# Patient Record
Sex: Male | Born: 1945 | Race: White | Hispanic: No | Marital: Married | Smoking: Never smoker
Health system: Southern US, Community
[De-identification: ages and names within clinical notes are randomized; demographics above are authoritative.]

## PROBLEM LIST (undated history)

## (undated) DIAGNOSIS — M1990 Unspecified osteoarthritis, unspecified site: Secondary | ICD-10-CM

## (undated) DIAGNOSIS — C801 Malignant (primary) neoplasm, unspecified: Secondary | ICD-10-CM

## (undated) DIAGNOSIS — I10 Essential (primary) hypertension: Secondary | ICD-10-CM

## (undated) DIAGNOSIS — Z951 Presence of aortocoronary bypass graft: Secondary | ICD-10-CM

## (undated) DIAGNOSIS — E7800 Pure hypercholesterolemia, unspecified: Secondary | ICD-10-CM

## (undated) DIAGNOSIS — I2510 Atherosclerotic heart disease of native coronary artery without angina pectoris: Secondary | ICD-10-CM

## (undated) HISTORY — PX: MOHS SURGERY: SUR867

## (undated) HISTORY — PX: CORONARY ARTERY BYPASS GRAFT: SHX141

## (undated) HISTORY — PX: MODIFIED RADICAL MASTECTOMY: SHX5703

## (undated) HISTORY — PX: CARDIAC SURGERY: SHX584

---

## 1997-08-08 ENCOUNTER — Other Ambulatory Visit: Admission: RE | Admit: 1997-08-08 | Discharge: 1997-08-08 | Payer: Self-pay | Admitting: Otolaryngology

## 1997-09-22 ENCOUNTER — Emergency Department (HOSPITAL_COMMUNITY): Admission: EM | Admit: 1997-09-22 | Discharge: 1997-09-22 | Payer: Self-pay | Admitting: Emergency Medicine

## 2000-04-12 ENCOUNTER — Encounter: Admission: RE | Admit: 2000-04-12 | Discharge: 2000-04-12 | Payer: Self-pay | Admitting: *Deleted

## 2000-04-12 ENCOUNTER — Encounter: Payer: Self-pay | Admitting: *Deleted

## 2010-04-25 HISTORY — PX: CORONARY ARTERY BYPASS GRAFT: SHX141

## 2011-07-14 ENCOUNTER — Encounter (HOSPITAL_COMMUNITY)
Admission: RE | Admit: 2011-07-14 | Discharge: 2011-07-14 | Disposition: A | Discharge status: Home / Self Care | Payer: Non-veteran care | Source: Ambulatory Visit | Attending: Cardiology | Admitting: Cardiology

## 2011-07-14 DIAGNOSIS — I10 Essential (primary) hypertension: Secondary | ICD-10-CM | POA: Insufficient documentation

## 2011-07-14 DIAGNOSIS — Z5189 Encounter for other specified aftercare: Secondary | ICD-10-CM | POA: Insufficient documentation

## 2011-07-14 DIAGNOSIS — E785 Hyperlipidemia, unspecified: Secondary | ICD-10-CM | POA: Insufficient documentation

## 2011-07-14 DIAGNOSIS — Z951 Presence of aortocoronary bypass graft: Secondary | ICD-10-CM | POA: Insufficient documentation

## 2011-07-14 NOTE — Progress Notes (Signed)
Cardiac Rehab Medication Review by a Pharmacist  Does the patient  feel that his/her medications are working for him/her?  yes  Has the patient been experiencing any side effects to the medications prescribed?  no  Does the patient measure his/her own blood pressure or blood glucose at home?  no   Does the patient have any problems obtaining medications due to transportation or finances?   no  Understanding of regimen: good Understanding of indications: good Potential of compliance: excellent    Pharmacist comments: Patient did not remember to bring his medications in today but will bring them in next time. Please adjust strengths in medication history as appropriate.    Richard Camacho, [place] R 07/14/2011 8:33 AM

## 2011-07-18 ENCOUNTER — Encounter (HOSPITAL_COMMUNITY)
Admission: RE | Admit: 2011-07-18 | Discharge: 2011-07-18 | Disposition: A | Discharge status: Home / Self Care | Payer: Non-veteran care | Source: Ambulatory Visit | Attending: Cardiology | Admitting: Cardiology

## 2011-07-18 NOTE — Progress Notes (Signed)
Pt started cardiac rehab today.  Pt tolerated light exercise without difficulty. Monitor showed SR with st depression and no other ectopy noted.  Pt plans to exercise on 6:45 on Mondays and Wednesdays but 8:15 on Fridays.  Continue to monitor.

## 2011-07-20 ENCOUNTER — Encounter (HOSPITAL_COMMUNITY)
Admission: RE | Admit: 2011-07-20 | Discharge: 2011-07-20 | Disposition: A | Discharge status: Home / Self Care | Payer: Non-veteran care | Source: Ambulatory Visit | Attending: Cardiology | Admitting: Cardiology

## 2011-07-20 NOTE — Progress Notes (Signed)
Richard Camacho 66 y.o. male       Nutrition Screen                                                                    YES  NO Do you live in a nursing home?  X   Do you eat out more than 3 times/week?    X If yes, how many times per week do you eat out?  Do you have food allergies?   X If yes, what are you allergic to?  Have you gained or lost more than 10 lbs without trying?               X If yes, how much weight have you lost and over what time period?  lbs gained or lost over  weeks/month  Do you want to lose weight?    X  If yes, what is a goal weight or amount of weight you would like to lose? 185 lb/ 30 lb goal  Do you eat alone most of the time?   X   Do you eat less than 2 meals/day?  X If yes, how many meals do you eat?  Do you drink more than 3 alcohol drinks/day?  X If yes, how many drinks per day?  Are you having trouble with constipation? *  X If yes, what are you doing to help relieve constipation?  Do you have financial difficulties with buying food?*    X   Are you experiencing regular nausea/ vomiting?*     X   Do you have a poor appetite? *                                        X   Do you have trouble chewing/swallowing? *   X    Pt with diagnoses of:  X CABG              X Dyslipidemia  / HDL< 40 / LDL>70 / High TG      X %  Body fat >goal / Body Mass Index >25 X HTN / BP >120/80       Pt Risk Score   1       Diagnosis Risk Score  20       Total Risk Score   21                         High Risk               X Low Risk    HT: 70.75" Ht Readings from Last 1 Encounters:  07/14/11 5' 10.75" (1.797 m)    WT:   211.9 lb (96.3 kg) Wt Readings from Last 3 Encounters:  07/14/11 212 lb 4.9 oz (96.3 kg)     IBW 77.5 124%IBW BMI 29.8 30.1%body fat  Meds reviewed: MVI, Vitamin D, Diovan  PMH: sleep apnea/ hypersomnia Activity level: Pt is active    Wt goal: 188-200 lb ( 85.5-90.9 kg) Current tobacco use? No      Food/Drug Interaction? No  Labs:  Lipid  Panel  No results found for this basename: chol, trig, hdl, cholhdl, vldl, ldlcalc   No results found for this basename: HGBA1C  03/28/11 Glucose 140  LDL goal: < 100       MI, DM, Carotid or PVD and > 2:      HTN, > 67 yo male Estimated Daily Nutrition Needs for: ? wt loss  1650 Kcal , Total Fat 2150gm, Saturated Fat 45-60 gm, Trans Fat 1.6-2.1 gm,  Sodium less than 1500 mg

## 2011-07-22 ENCOUNTER — Encounter (HOSPITAL_COMMUNITY): Payer: Non-veteran care

## 2011-07-22 ENCOUNTER — Encounter (HOSPITAL_COMMUNITY)
Admission: RE | Admit: 2011-07-22 | Discharge: 2011-07-22 | Disposition: A | Discharge status: Home / Self Care | Payer: Non-veteran care | Source: Ambulatory Visit | Attending: Cardiology | Admitting: Cardiology

## 2011-07-22 NOTE — Progress Notes (Signed)
Rehab report, progress summary and treatment plan sent to Non-Va Coordinator per request.

## 2011-07-25 ENCOUNTER — Encounter (HOSPITAL_COMMUNITY)
Admission: RE | Admit: 2011-07-25 | Discharge: 2011-07-25 | Disposition: A | Discharge status: Home / Self Care | Payer: Non-veteran care | Source: Ambulatory Visit | Attending: Cardiology | Admitting: Cardiology

## 2011-07-25 DIAGNOSIS — E785 Hyperlipidemia, unspecified: Secondary | ICD-10-CM | POA: Insufficient documentation

## 2011-07-25 DIAGNOSIS — I10 Essential (primary) hypertension: Secondary | ICD-10-CM | POA: Insufficient documentation

## 2011-07-25 DIAGNOSIS — Z5189 Encounter for other specified aftercare: Secondary | ICD-10-CM | POA: Insufficient documentation

## 2011-07-25 DIAGNOSIS — Z951 Presence of aortocoronary bypass graft: Secondary | ICD-10-CM | POA: Insufficient documentation

## 2011-07-27 ENCOUNTER — Encounter (HOSPITAL_COMMUNITY)
Admission: RE | Admit: 2011-07-27 | Discharge: 2011-07-27 | Disposition: A | Discharge status: Home / Self Care | Payer: Non-veteran care | Source: Ambulatory Visit | Attending: Cardiology | Admitting: Cardiology

## 2011-07-29 ENCOUNTER — Encounter (HOSPITAL_COMMUNITY)
Admission: RE | Admit: 2011-07-29 | Discharge: 2011-07-29 | Disposition: A | Discharge status: Home / Self Care | Payer: Non-veteran care | Source: Ambulatory Visit | Attending: Cardiology | Admitting: Cardiology

## 2011-07-29 ENCOUNTER — Encounter (HOSPITAL_COMMUNITY): Payer: Non-veteran care

## 2011-08-01 ENCOUNTER — Encounter (HOSPITAL_COMMUNITY)
Admission: RE | Admit: 2011-08-01 | Discharge: 2011-08-01 | Disposition: A | Discharge status: Home / Self Care | Payer: Non-veteran care | Source: Ambulatory Visit | Attending: Cardiology | Admitting: Cardiology

## 2011-08-03 ENCOUNTER — Encounter (HOSPITAL_COMMUNITY)
Admission: RE | Admit: 2011-08-03 | Discharge: 2011-08-03 | Disposition: A | Discharge status: Home / Self Care | Payer: Non-veteran care | Source: Ambulatory Visit | Attending: Cardiology | Admitting: Cardiology

## 2011-08-03 NOTE — Progress Notes (Signed)
Richard Camacho 66 y.o. male Nutrition Note Spoke with pt.  Nutrition Plan reviewed with pt. Pt's MEDFICTS was returned incomplete. Per discussion with pt, pt tracks his daily food intake on-line. Pt to brink food log in to class next week. Pt wants to lose wt and has decreased portion sizes eaten to promote wt loss. Weight loss tips discussed.    Nutrition Diagnosis   Food-and nutrition-related knowledge deficit related to lack of exposure to information as related to diagnosis of: ? CVD    Overweight related to excessive energy intake as evidenced by a BMI of 29.8  Nutrition RX/ Estimated Daily Nutrition Needs for: wt loss  1650-2150 Kcal, 45-60 gm fat, 12-16 gm sat fat, 1.6-2.1 gm trans-fat, <1500 mg sodium  Nutrition Intervention   Pt's individual nutrition plan including cholesterol goals reviewed with pt.   Pt to attend the Portion Distortion class   Pt to attend the  ? Nutrition I class                         ? Nutrition II class    Pt given handouts for: ? wt loss   Continue client-centered nutrition education by RD, as part of interdisciplinary care. Goal(s)   Pt to identify and limit food sources of saturated fat, trans fat, and cholesterol   Pt to identify food quantities necessary to achieve: ? wt loss to a goal wt of 188-200 lb (85.5-90.9 kg) at graduation from cardiac rehab.  Monitor and Evaluate progress toward nutrition goal with team.

## 2011-08-05 ENCOUNTER — Encounter (HOSPITAL_COMMUNITY): Payer: Non-veteran care

## 2011-08-08 ENCOUNTER — Encounter (HOSPITAL_COMMUNITY)
Admission: RE | Admit: 2011-08-08 | Discharge: 2011-08-08 | Disposition: A | Discharge status: Home / Self Care | Payer: Non-veteran care | Source: Ambulatory Visit | Attending: Cardiology | Admitting: Cardiology

## 2011-08-10 ENCOUNTER — Encounter (HOSPITAL_COMMUNITY)
Admission: RE | Admit: 2011-08-10 | Discharge: 2011-08-10 | Disposition: A | Discharge status: Home / Self Care | Payer: Non-veteran care | Source: Ambulatory Visit | Attending: Cardiology | Admitting: Cardiology

## 2011-08-10 NOTE — Progress Notes (Signed)
Reviewed home exercise with pt today.  Pt plans to walk, use light resistive bands, and exercise at a health club for exercise.  Reviewed THR, pulse, RPE, sign and symptoms, and when to call 911 or MD.  Pt voiced understanding. Electronically signed by Harriett Sine MS on Wednesday August 10 2011 at [id number]

## 2011-08-12 ENCOUNTER — Encounter (HOSPITAL_COMMUNITY): Payer: Non-veteran care

## 2011-08-15 ENCOUNTER — Encounter (HOSPITAL_COMMUNITY)
Admission: RE | Admit: 2011-08-15 | Discharge: 2011-08-15 | Disposition: A | Discharge status: Home / Self Care | Payer: Non-veteran care | Source: Ambulatory Visit | Attending: Cardiology | Admitting: Cardiology

## 2011-08-17 ENCOUNTER — Encounter (HOSPITAL_COMMUNITY)
Admission: RE | Admit: 2011-08-17 | Discharge: 2011-08-17 | Disposition: A | Discharge status: Home / Self Care | Payer: Non-veteran care | Source: Ambulatory Visit | Attending: Cardiology | Admitting: Cardiology

## 2011-08-19 ENCOUNTER — Encounter (HOSPITAL_COMMUNITY): Payer: Non-veteran care

## 2011-08-22 ENCOUNTER — Encounter (HOSPITAL_COMMUNITY)
Admission: RE | Admit: 2011-08-22 | Discharge: 2011-08-22 | Disposition: A | Discharge status: Home / Self Care | Payer: Non-veteran care | Source: Ambulatory Visit | Attending: Cardiology | Admitting: Cardiology

## 2011-08-24 ENCOUNTER — Encounter (HOSPITAL_COMMUNITY)
Admission: RE | Admit: 2011-08-24 | Discharge: 2011-08-24 | Disposition: A | Discharge status: Home / Self Care | Payer: Non-veteran care | Source: Ambulatory Visit | Attending: Cardiology | Admitting: Cardiology

## 2011-08-24 DIAGNOSIS — Z5189 Encounter for other specified aftercare: Secondary | ICD-10-CM | POA: Insufficient documentation

## 2011-08-24 DIAGNOSIS — I10 Essential (primary) hypertension: Secondary | ICD-10-CM | POA: Insufficient documentation

## 2011-08-24 DIAGNOSIS — Z951 Presence of aortocoronary bypass graft: Secondary | ICD-10-CM | POA: Insufficient documentation

## 2011-08-24 DIAGNOSIS — E785 Hyperlipidemia, unspecified: Secondary | ICD-10-CM | POA: Insufficient documentation

## 2011-08-24 NOTE — Progress Notes (Signed)
Pt in today for cardiac rehab.  During first station, pt called RN over.  Pt reports that he had an episode of visual disturbances yesterday while driving his car.  Pt reports that for about 5 minutes he had a bright light that was shinning in his eyes and complained of floaters to the side.  Pt was alarmed and called his wife.  Pt thought about pulling over to the side of the road because he felt it was not safe to continue driving.  At that point the disturbance went away with no return of symptoms.  Pt reported that he has never felt this before.  Pt denied any other symptoms of discomfort, nausea, speech, loss of motor activity.  Pt plans to contact his primary physician for further assessment and possible contact his eye doctor who he saw last year.  Pt denies any symptoms today with exercise.

## 2011-08-26 ENCOUNTER — Encounter (HOSPITAL_COMMUNITY): Payer: Non-veteran care

## 2011-08-29 ENCOUNTER — Encounter (HOSPITAL_COMMUNITY)
Admission: RE | Admit: 2011-08-29 | Discharge: 2011-08-29 | Disposition: A | Discharge status: Home / Self Care | Payer: Non-veteran care | Source: Ambulatory Visit | Attending: Cardiology | Admitting: Cardiology

## 2011-08-29 NOTE — Progress Notes (Signed)
Pt el ected not to contact his primary about his symptoms.  Pt will discuss this with his md on his next visit.

## 2011-08-31 ENCOUNTER — Encounter (HOSPITAL_COMMUNITY): Payer: Non-veteran care

## 2011-08-31 ENCOUNTER — Telehealth (HOSPITAL_COMMUNITY): Payer: Self-pay | Admitting: Cardiac Rehabilitation

## 2011-09-02 ENCOUNTER — Encounter (HOSPITAL_COMMUNITY): Payer: Non-veteran care

## 2011-09-05 ENCOUNTER — Encounter (HOSPITAL_COMMUNITY)
Admission: RE | Admit: 2011-09-05 | Discharge: 2011-09-05 | Disposition: A | Discharge status: Home / Self Care | Payer: Non-veteran care | Source: Ambulatory Visit | Attending: Cardiology | Admitting: Cardiology

## 2011-09-07 ENCOUNTER — Encounter (HOSPITAL_COMMUNITY)
Admission: RE | Admit: 2011-09-07 | Discharge: 2011-09-07 | Disposition: A | Discharge status: Home / Self Care | Payer: Non-veteran care | Source: Ambulatory Visit | Attending: Cardiology | Admitting: Cardiology

## 2011-09-09 ENCOUNTER — Encounter (HOSPITAL_COMMUNITY): Payer: Non-veteran care

## 2011-09-09 ENCOUNTER — Encounter (HOSPITAL_COMMUNITY)
Admission: RE | Admit: 2011-09-09 | Discharge: 2011-09-09 | Disposition: A | Discharge status: Home / Self Care | Payer: Non-veteran care | Source: Ambulatory Visit | Attending: Cardiology | Admitting: Cardiology

## 2011-09-12 ENCOUNTER — Encounter (HOSPITAL_COMMUNITY)
Admission: RE | Admit: 2011-09-12 | Discharge: 2011-09-12 | Disposition: A | Discharge status: Home / Self Care | Payer: Non-veteran care | Source: Ambulatory Visit | Attending: Cardiology | Admitting: Cardiology

## 2011-09-12 NOTE — Progress Notes (Signed)
Richard Camacho 66 y.o. male Nutrition Note Spoke with pt.  Incomplete MEDFICTS completed with pt. Nutrition Survey results reviewed. Per previous discussion with pt, pt tracks his daily food intake on-line and pt wanted to bring food log in for this writer to review. Pt has not brought his food log in for review at this point. Pt eats out daily for lunch with his partners. Per discussion, pt choosing salads frequently. Eating out heart healthy briefly reviewed. Pt expressed understanding. Nutrition Diagnosis   Food-and nutrition-related knowledge deficit related to lack of exposure to information as related to diagnosis of: ? CVD    Overweight related to excessive energy intake as evidenced by a BMI of 29.8 Nutrition RX/ Estimated Daily Nutrition Needs for: wt loss  1650-2150 Kcal, 45-60 gm fat, 12-16 gm sat fat, 1.6-2.1 gm trans-fat, <1500 mg sodium  Nutrition Intervention   Benefits of adopting Therapeutic Lifestyle Changes discussed when Medficts reviewed.   Pt to attend the Portion Distortion class   Pt to attend the  ? Nutrition I class                         ? Nutrition II class   Continue client-centered nutrition education by RD, as part of interdisciplinary care. Goal(s)   Pt to identify and limit food sources of saturated fat, trans fat, and cholesterol   Pt to identify food quantities necessary to achieve: ? wt loss to a goal wt of 188-200 lb (85.5-90.9 kg) at graduation from cardiac rehab.  Monitor and Evaluate progress toward nutrition goal with team.

## 2011-09-14 ENCOUNTER — Encounter (HOSPITAL_COMMUNITY)
Admission: RE | Admit: 2011-09-14 | Discharge: 2011-09-14 | Disposition: A | Discharge status: Home / Self Care | Payer: Non-veteran care | Source: Ambulatory Visit | Attending: Cardiology | Admitting: Cardiology

## 2011-09-16 ENCOUNTER — Encounter (HOSPITAL_COMMUNITY)
Admission: RE | Admit: 2011-09-16 | Discharge: 2011-09-16 | Disposition: A | Discharge status: Home / Self Care | Payer: Non-veteran care | Source: Ambulatory Visit | Attending: Cardiology | Admitting: Cardiology

## 2011-09-16 ENCOUNTER — Encounter (HOSPITAL_COMMUNITY): Payer: Non-veteran care

## 2011-09-21 ENCOUNTER — Encounter (HOSPITAL_COMMUNITY)
Admission: RE | Admit: 2011-09-21 | Discharge: 2011-09-21 | Disposition: A | Discharge status: Home / Self Care | Payer: Non-veteran care | Source: Ambulatory Visit | Attending: Cardiology | Admitting: Cardiology

## 2011-09-23 ENCOUNTER — Encounter (HOSPITAL_COMMUNITY)
Admission: RE | Admit: 2011-09-23 | Discharge: 2011-09-23 | Disposition: A | Discharge status: Home / Self Care | Payer: Non-veteran care | Source: Ambulatory Visit | Attending: Cardiology | Admitting: Cardiology

## 2011-09-23 ENCOUNTER — Encounter (HOSPITAL_COMMUNITY): Payer: Non-veteran care

## 2011-09-26 ENCOUNTER — Encounter (HOSPITAL_COMMUNITY)
Admission: RE | Admit: 2011-09-26 | Discharge: 2011-09-26 | Disposition: A | Discharge status: Home / Self Care | Payer: Non-veteran care | Source: Ambulatory Visit | Attending: Cardiology | Admitting: Cardiology

## 2011-09-26 DIAGNOSIS — Z5189 Encounter for other specified aftercare: Secondary | ICD-10-CM | POA: Insufficient documentation

## 2011-09-26 DIAGNOSIS — Z951 Presence of aortocoronary bypass graft: Secondary | ICD-10-CM | POA: Insufficient documentation

## 2011-09-26 DIAGNOSIS — I10 Essential (primary) hypertension: Secondary | ICD-10-CM | POA: Insufficient documentation

## 2011-09-26 DIAGNOSIS — E785 Hyperlipidemia, unspecified: Secondary | ICD-10-CM | POA: Insufficient documentation

## 2011-09-28 ENCOUNTER — Encounter (HOSPITAL_COMMUNITY)
Admission: RE | Admit: 2011-09-28 | Discharge: 2011-09-28 | Disposition: A | Discharge status: Home / Self Care | Payer: Non-veteran care | Source: Ambulatory Visit | Attending: Cardiology | Admitting: Cardiology

## 2011-09-30 ENCOUNTER — Encounter (HOSPITAL_COMMUNITY): Payer: Non-veteran care

## 2011-09-30 ENCOUNTER — Encounter (HOSPITAL_COMMUNITY)
Admission: RE | Admit: 2011-09-30 | Discharge: 2011-09-30 | Disposition: A | Discharge status: Home / Self Care | Payer: Non-veteran care | Source: Ambulatory Visit | Attending: Cardiology | Admitting: Cardiology

## 2011-10-03 ENCOUNTER — Encounter (HOSPITAL_COMMUNITY)
Admission: RE | Admit: 2011-10-03 | Discharge: 2011-10-03 | Disposition: A | Discharge status: Home / Self Care | Payer: Non-veteran care | Source: Ambulatory Visit | Attending: Cardiology | Admitting: Cardiology

## 2011-10-05 ENCOUNTER — Encounter (HOSPITAL_COMMUNITY)
Admission: RE | Admit: 2011-10-05 | Discharge: 2011-10-05 | Disposition: A | Discharge status: Home / Self Care | Payer: Non-veteran care | Source: Ambulatory Visit | Attending: Cardiology | Admitting: Cardiology

## 2011-10-06 NOTE — Progress Notes (Signed)
Spoke with patient at great length regarding physical sensations he feels after sitting in reclined position.  Reassured pt that recovery from heart surgery is lengthy and to be careful not to overdo to quickly.  Pt encouraged to contact his physician in follow up.

## 2011-10-07 ENCOUNTER — Encounter (HOSPITAL_COMMUNITY)
Admission: RE | Admit: 2011-10-07 | Discharge: 2011-10-07 | Disposition: A | Discharge status: Home / Self Care | Payer: Non-veteran care | Source: Ambulatory Visit | Attending: Cardiology | Admitting: Cardiology

## 2011-10-07 ENCOUNTER — Encounter (HOSPITAL_COMMUNITY): Payer: Non-veteran care

## 2011-10-10 ENCOUNTER — Encounter (HOSPITAL_COMMUNITY)
Admission: RE | Admit: 2011-10-10 | Discharge: 2011-10-10 | Disposition: A | Discharge status: Home / Self Care | Payer: Non-veteran care | Source: Ambulatory Visit | Attending: Cardiology | Admitting: Cardiology

## 2011-10-12 ENCOUNTER — Encounter (HOSPITAL_COMMUNITY)
Admission: RE | Admit: 2011-10-12 | Discharge: 2011-10-12 | Disposition: A | Discharge status: Home / Self Care | Payer: Non-veteran care | Source: Ambulatory Visit | Attending: Cardiology | Admitting: Cardiology

## 2011-10-14 ENCOUNTER — Encounter (HOSPITAL_COMMUNITY)
Admission: RE | Admit: 2011-10-14 | Discharge: 2011-10-14 | Disposition: A | Discharge status: Home / Self Care | Payer: Non-veteran care | Source: Ambulatory Visit | Attending: Cardiology | Admitting: Cardiology

## 2011-10-14 ENCOUNTER — Encounter (HOSPITAL_COMMUNITY): Payer: Non-veteran care

## 2011-10-14 NOTE — Progress Notes (Signed)
Pt will be absent from cardiac rehab the week of 6/24 due to vacation at the beach.

## 2011-10-17 ENCOUNTER — Encounter (HOSPITAL_COMMUNITY): Payer: Non-veteran care

## 2011-10-19 ENCOUNTER — Encounter (HOSPITAL_COMMUNITY): Payer: Non-veteran care

## 2011-10-21 ENCOUNTER — Encounter (HOSPITAL_COMMUNITY): Payer: Non-veteran care

## 2011-10-24 ENCOUNTER — Encounter (HOSPITAL_COMMUNITY)
Admission: RE | Admit: 2011-10-24 | Discharge: 2011-10-24 | Disposition: A | Discharge status: Home / Self Care | Payer: Non-veteran care | Source: Ambulatory Visit | Attending: Cardiology | Admitting: Cardiology

## 2011-10-24 DIAGNOSIS — I10 Essential (primary) hypertension: Secondary | ICD-10-CM | POA: Insufficient documentation

## 2011-10-24 DIAGNOSIS — E785 Hyperlipidemia, unspecified: Secondary | ICD-10-CM | POA: Insufficient documentation

## 2011-10-24 DIAGNOSIS — Z5189 Encounter for other specified aftercare: Secondary | ICD-10-CM | POA: Insufficient documentation

## 2011-10-24 DIAGNOSIS — Z951 Presence of aortocoronary bypass graft: Secondary | ICD-10-CM | POA: Insufficient documentation

## 2011-10-26 ENCOUNTER — Encounter (HOSPITAL_COMMUNITY)
Admission: RE | Admit: 2011-10-26 | Discharge: 2011-10-26 | Disposition: A | Discharge status: Home / Self Care | Payer: Non-veteran care | Source: Ambulatory Visit | Attending: Cardiology | Admitting: Cardiology

## 2011-10-28 ENCOUNTER — Encounter (HOSPITAL_COMMUNITY)
Admission: RE | Admit: 2011-10-28 | Discharge: 2011-10-28 | Disposition: A | Discharge status: Home / Self Care | Payer: Non-veteran care | Source: Ambulatory Visit | Attending: Cardiology | Admitting: Cardiology

## 2011-10-28 NOTE — Progress Notes (Signed)
Pt graduated from exercise program.  Medication list reconciled.  Pt given information about the maintenance program and will contact us if he desires to attend.

## 2011-10-31 ENCOUNTER — Encounter (HOSPITAL_COMMUNITY): Payer: Non-veteran care

## 2011-11-02 ENCOUNTER — Encounter (HOSPITAL_COMMUNITY): Payer: Non-veteran care

## 2011-11-04 ENCOUNTER — Encounter (HOSPITAL_COMMUNITY): Payer: Non-veteran care

## 2011-11-07 ENCOUNTER — Encounter (HOSPITAL_COMMUNITY): Payer: Non-veteran care

## 2011-11-09 ENCOUNTER — Encounter (HOSPITAL_COMMUNITY): Payer: Non-veteran care

## 2011-11-11 ENCOUNTER — Encounter (HOSPITAL_COMMUNITY): Payer: Non-veteran care

## 2011-11-14 ENCOUNTER — Encounter (HOSPITAL_COMMUNITY): Payer: Non-veteran care

## 2011-11-16 ENCOUNTER — Encounter (HOSPITAL_COMMUNITY): Payer: Non-veteran care

## 2011-11-18 ENCOUNTER — Encounter (HOSPITAL_COMMUNITY): Payer: Non-veteran care

## 2012-08-08 DIAGNOSIS — Z85828 Personal history of other malignant neoplasm of skin: Secondary | ICD-10-CM | POA: Insufficient documentation

## 2013-07-18 ENCOUNTER — Emergency Department (HOSPITAL_COMMUNITY): Payer: Non-veteran care

## 2013-07-18 ENCOUNTER — Encounter (HOSPITAL_COMMUNITY): Payer: Self-pay | Admitting: Emergency Medicine

## 2013-07-18 ENCOUNTER — Observation Stay (HOSPITAL_COMMUNITY)
Admission: EM | Admit: 2013-07-18 | Discharge: 2013-07-20 | Disposition: A | Discharge status: Home / Self Care | Payer: Non-veteran care | Attending: Internal Medicine | Admitting: Internal Medicine

## 2013-07-18 DIAGNOSIS — E7800 Pure hypercholesterolemia, unspecified: Secondary | ICD-10-CM | POA: Insufficient documentation

## 2013-07-18 DIAGNOSIS — E782 Mixed hyperlipidemia: Secondary | ICD-10-CM | POA: Diagnosis present

## 2013-07-18 DIAGNOSIS — Z951 Presence of aortocoronary bypass graft: Secondary | ICD-10-CM | POA: Insufficient documentation

## 2013-07-18 DIAGNOSIS — R0602 Shortness of breath: Secondary | ICD-10-CM | POA: Insufficient documentation

## 2013-07-18 DIAGNOSIS — Z7982 Long term (current) use of aspirin: Secondary | ICD-10-CM | POA: Insufficient documentation

## 2013-07-18 DIAGNOSIS — E785 Hyperlipidemia, unspecified: Secondary | ICD-10-CM | POA: Insufficient documentation

## 2013-07-18 DIAGNOSIS — I2510 Atherosclerotic heart disease of native coronary artery without angina pectoris: Secondary | ICD-10-CM | POA: Diagnosis present

## 2013-07-18 DIAGNOSIS — R55 Syncope and collapse: Principal | ICD-10-CM | POA: Diagnosis present

## 2013-07-18 DIAGNOSIS — I498 Other specified cardiac arrhythmias: Secondary | ICD-10-CM | POA: Insufficient documentation

## 2013-07-18 DIAGNOSIS — R001 Bradycardia, unspecified: Secondary | ICD-10-CM

## 2013-07-18 DIAGNOSIS — I10 Essential (primary) hypertension: Secondary | ICD-10-CM | POA: Diagnosis present

## 2013-07-18 DIAGNOSIS — Z9010 Acquired absence of unspecified breast and nipple: Secondary | ICD-10-CM | POA: Insufficient documentation

## 2013-07-18 DIAGNOSIS — G4733 Obstructive sleep apnea (adult) (pediatric): Secondary | ICD-10-CM | POA: Insufficient documentation

## 2013-07-18 HISTORY — DX: Pure hypercholesterolemia, unspecified: E78.00

## 2013-07-18 HISTORY — DX: Atherosclerotic heart disease of native coronary artery without angina pectoris: I25.10

## 2013-07-18 HISTORY — DX: Essential (primary) hypertension: I10

## 2013-07-18 LAB — I-STAT CHEM 8, ED
BUN: 13 mg/dL (ref 6–23)
Calcium, Ion: 1.25 mmol/L (ref 1.13–1.30)
Chloride: 105 mEq/L (ref 96–112)
Creatinine, Ser: 0.8 mg/dL (ref 0.50–1.35)
Glucose, Bld: 108 mg/dL — ABNORMAL HIGH (ref 70–99)
HCT: 43 % (ref 39.0–52.0)
HEMOGLOBIN: 14.6 g/dL (ref 13.0–17.0)
POTASSIUM: 4 meq/L (ref 3.7–5.3)
SODIUM: 141 meq/L (ref 137–147)
TCO2: 26 mmol/L (ref 0–100)

## 2013-07-18 LAB — RAPID URINE DRUG SCREEN, HOSP PERFORMED
Amphetamines: NOT DETECTED
BENZODIAZEPINES: NOT DETECTED
Barbiturates: NOT DETECTED
Cocaine: NOT DETECTED
Opiates: NOT DETECTED
Tetrahydrocannabinol: NOT DETECTED

## 2013-07-18 LAB — CBC WITH DIFFERENTIAL/PLATELET
BASOS PCT: 1 % (ref 0–1)
Basophils Absolute: 0 10*3/uL (ref 0.0–0.1)
Eosinophils Absolute: 0.7 10*3/uL (ref 0.0–0.7)
Eosinophils Relative: 9 % — ABNORMAL HIGH (ref 0–5)
HCT: 39.3 % (ref 39.0–52.0)
Hemoglobin: 14.3 g/dL (ref 13.0–17.0)
Lymphocytes Relative: 33 % (ref 12–46)
Lymphs Abs: 2.4 10*3/uL (ref 0.7–4.0)
MCH: 34.8 pg — ABNORMAL HIGH (ref 26.0–34.0)
MCHC: 36.4 g/dL — AB (ref 30.0–36.0)
MCV: 95.6 fL (ref 78.0–100.0)
Monocytes Absolute: 0.5 10*3/uL (ref 0.1–1.0)
Monocytes Relative: 7 % (ref 3–12)
NEUTROS PCT: 50 % (ref 43–77)
Neutro Abs: 3.7 10*3/uL (ref 1.7–7.7)
PLATELETS: 232 10*3/uL (ref 150–400)
RBC: 4.11 MIL/uL — ABNORMAL LOW (ref 4.22–5.81)
RDW: 12.3 % (ref 11.5–15.5)
WBC: 7.3 10*3/uL (ref 4.0–10.5)

## 2013-07-18 LAB — URINALYSIS, ROUTINE W REFLEX MICROSCOPIC
Bilirubin Urine: NEGATIVE
GLUCOSE, UA: NEGATIVE mg/dL
Hgb urine dipstick: NEGATIVE
Ketones, ur: NEGATIVE mg/dL
LEUKOCYTES UA: NEGATIVE
Nitrite: NEGATIVE
PH: 6.5 (ref 5.0–8.0)
Protein, ur: NEGATIVE mg/dL
Specific Gravity, Urine: 1.013 (ref 1.005–1.030)
Urobilinogen, UA: 0.2 mg/dL (ref 0.0–1.0)

## 2013-07-18 LAB — I-STAT TROPONIN, ED: TROPONIN I, POC: 0.01 ng/mL (ref 0.00–0.08)

## 2013-07-18 LAB — ETHANOL: Alcohol, Ethyl (B): <11 mg/dL (ref 0–11)

## 2013-07-18 LAB — COMPREHENSIVE METABOLIC PANEL
ALBUMIN: 4 g/dL (ref 3.5–5.2)
ALK PHOS: 60 U/L (ref 39–117)
ALT: 20 U/L (ref 0–53)
AST: 25 U/L (ref 0–37)
BUN: 13 mg/dL (ref 6–23)
CO2: 23 mEq/L (ref 19–32)
Calcium: 9.9 mg/dL (ref 8.4–10.5)
Chloride: 103 mEq/L (ref 96–112)
Creatinine, Ser: 0.7 mg/dL (ref 0.50–1.35)
GFR calc Af Amer: >90 mL/min (ref >90)
GFR calc non Af Amer: >90 mL/min (ref >90)
Glucose, Bld: 112 mg/dL — ABNORMAL HIGH (ref 70–99)
POTASSIUM: 4.2 meq/L (ref 3.7–5.3)
SODIUM: 140 meq/L (ref 137–147)
TOTAL PROTEIN: 7 g/dL (ref 6.0–8.3)
Total Bilirubin: 0.4 mg/dL (ref 0.3–1.2)

## 2013-07-18 LAB — CBG MONITORING, ED: Glucose-Capillary: 100 mg/dL — ABNORMAL HIGH (ref 70–99)

## 2013-07-18 LAB — APTT: aPTT: 30 seconds (ref 24–37)

## 2013-07-18 LAB — PROTIME-INR
INR: 0.84 (ref 0.00–1.49)
Prothrombin Time: 11.4 seconds — ABNORMAL LOW (ref 11.6–15.2)

## 2013-07-18 NOTE — ED Provider Notes (Signed)
CSN: [id number]     Arrival date & time 07/18/13  1908 History   First MD Initiated Contact with Patient 07/18/13 1924     Chief Complaint  Patient presents with  . Near Syncope  . Episode of bradycadia      (Consider location/radiation/quality/duration/timing/severity/associated sxs/prior Treatment) HPI Comments: Patient is a 68 year old male with history of hypertension, hyperlipidemia, CABG who presents today after having an episode at work. He reports that he had been hearing people on the phone with slurred speech, but people in person did not have slurred speech. He then noticed the words on the computer screen were "jumping around". He went to dial 911 because he had an impending sense of doom. At that time that he felt as though he had loss of motor function in both of his hands. He was able to finish the phone call to 911 and felt as though he was improving. He was diaphoretic and pale for EMS, but was warm and dry on arrival to ED. He reports that he feels well, but has had episodes of feeling "swimmyheaded" sine arriving to the ED. He has never had symptoms like this in the past. He denies any chest pain or shortness of breath.  Patient is a 68 y.o. male presenting with near-syncope. The history is provided by the patient. No language interpreter was used.  Near Syncope Associated symptoms include diaphoresis, nausea and weakness. Pertinent negatives include no abdominal pain, chest pain, chills, fever or vomiting.    Past Medical History  Diagnosis Date  . Hypertension   . High cholesterol    Past Surgical History  Procedure Laterality Date  . Cardiac surgery    . Coronary artery bypass graft     History reviewed. No pertinent family history. History  Substance Use Topics  . Smoking status: Never Smoker   . Smokeless tobacco: Not on file  . Alcohol Use: Yes    Review of Systems  Constitutional: Positive for diaphoresis. Negative for fever and chills.  Eyes: Positive  for visual disturbance.  Respiratory: Negative for shortness of breath.   Cardiovascular: Positive for near-syncope. Negative for chest pain.  Gastrointestinal: Positive for nausea. Negative for vomiting and abdominal pain.  Neurological: Positive for dizziness, weakness and light-headedness.  All other systems reviewed and are negative.      Allergies  Ramipril  Home Medications   Current Outpatient Rx  Name  Route  Sig  Dispense  Refill  . aspirin EC 81 MG tablet   Oral   Take 81 mg by mouth daily.          . cetirizine (ZYRTEC) 10 MG tablet   Oral   Take 10 mg by mouth daily.         . Cholecalciferol (VITAMIN D) 2000 UNITS tablet   Oral   Take 2,000 Units by mouth daily.         Marland Kitchen DOXYCYCLINE PO   Oral   Take 1 tablet by mouth 2 (two) times daily.         . metoprolol tartrate (LOPRESSOR) 25 MG tablet   Oral   Take 12.5 mg by mouth 2 (two) times daily.         . Multiple Vitamins-Minerals (MULTIVITAMIN WITH MINERALS) tablet   Oral   Take 1 tablet by mouth daily.         Marland Kitchen PRESCRIPTION MEDICATION   Topical   Apply 1 application topically 2 (two) times daily. Cream for rosacea to face  twice daily. metrocream or metrogel         . simvastatin (ZOCOR) 40 MG tablet   Oral   Take 20 mg by mouth daily at 6 PM.         . valsartan (DIOVAN) 40 MG tablet   Oral   Take 20 mg by mouth 2 (two) times daily.         . vitamin E 400 UNIT capsule   Oral   Take 400 Units by mouth daily.          BP 140/86  Pulse 71  Temp(Src) 98.6 F (37 C) (Oral)  Resp 18  Wt 215 lb (97.523 kg)  SpO2 95% Physical Exam  Nursing note and vitals reviewed. Constitutional: He is oriented to person, place, and time. He appears well-developed and well-nourished. No distress.  HENT:  Head: Normocephalic and atraumatic.  Right Ear: External ear normal.  Left Ear: External ear normal.  Nose: Nose normal.  Eyes: Conjunctivae and EOM are normal. Pupils are equal,  round, and reactive to light.  Neck: Normal range of motion. No tracheal deviation present.  Cardiovascular: Normal rate, regular rhythm, normal heart sounds, intact distal pulses and normal pulses.   Pulses:      Radial pulses are 2+ on the right side, and 2+ on the left side.       Posterior tibial pulses are 2+ on the right side, and 2+ on the left side.  Pulmonary/Chest: Effort normal and breath sounds normal. No stridor.  Abdominal: Soft. He exhibits no distension. There is no tenderness.  Musculoskeletal: Normal range of motion.  Neurological: He is alert and oriented to person, place, and time. He has normal strength. Coordination normal.  Finger-nose-finger normal. Rapid alternating movements normal. Grip strength 5 out of 5 bilaterally. Strength 5 out of 5 and on extremities. No facial droop. No pronator drift.  Skin: Skin is warm and dry. He is not diaphoretic.  Psychiatric: He has a normal mood and affect. His behavior is normal.    ED Course  Procedures (including critical care time) Labs Review Labs Reviewed  CBC WITH DIFFERENTIAL - Abnormal; Notable for the following:    RBC 4.11 (*)    MCH 34.8 (*)    MCHC 36.4 (*)    Eosinophils Relative 9 (*)    All other components within normal limits  COMPREHENSIVE METABOLIC PANEL - Abnormal; Notable for the following:    Glucose, Bld 112 (*)    All other components within normal limits  PROTIME-INR - Abnormal; Notable for the following:    Prothrombin Time 11.4 (*)    All other components within normal limits  I-STAT CHEM 8, ED - Abnormal; Notable for the following:    Glucose, Bld 108 (*)    All other components within normal limits  CBG MONITORING, ED - Abnormal; Notable for the following:    Glucose-Capillary 100 (*)    All other components within normal limits  ETHANOL  APTT  URINE RAPID DRUG SCREEN (HOSP PERFORMED)  URINALYSIS, ROUTINE W REFLEX MICROSCOPIC  I-STAT TROPOININ, ED  I-STAT TROPOININ, ED  I-STAT  TROPOININ, ED   Imaging Review Ct Head Wo Contrast  07/18/2013   CLINICAL DATA:  Near syncope.  EXAM: CT HEAD WITHOUT CONTRAST  TECHNIQUE: Contiguous axial images were obtained from the base of the skull through the vertex without intravenous contrast.  COMPARISON:  None.  FINDINGS: The ventricles and sulci are within normal limits for age. There is no evidence  of acute infarct, intracranial hemorrhage, mass, midline shift, or extra-axial collection. The orbits are unremarkable. The mastoid air cells are clear. There is mild right anterior ethmoid air cell mucosal thickening. There is a right maxillary sinus mucosal thickening with a likely mucous retention cyst. There is no evidence of acute fracture.  IMPRESSION: No evidence of acute intracranial abnormality.   Electronically Signed   By: Logan Bores   On: 07/18/2013 20:22     EKG Interpretation   Date/Time:  Thursday July 18 2013 19:18:38 EDT Ventricular Rate:  60 PR Interval:  189 QRS Duration: 99 QT Interval:  434 QTC Calculation: 434 R Axis:   -5 Text Interpretation:  Sinus rhythm Abnormal R-wave progression, early  transition Inferior infarct, old No old tracing to compare Confirmed by  [facility]  MD, TREY ([id number]) on 07/18/2013 10:31:26 PM      MDM   Final diagnoses:  CAD (coronary artery disease)  Bradycardia  HTN (hypertension)  Near syncope   Patient presents to ED after episode of near syncope at work. Significant cardiac hx. When EMS arrived on scene patient was diaphoretic and pale with heart rate of 32. Patient has had normal heart rates in ED. Denies any CP or SOB. Concern for cardiac etiology of near syncope. Patient also hearing slurred speech from others. Coworker reports that he was acting normally without slurred speech himself. CT head was done for this reason. No acute findings. Patient will be admitted to medicine for further workup. Dr. Doy Mince evaluated patient and agrees with plan. Vital signs stable at this  time. Patient / Family / Caregiver informed of clinical course, understand medical decision-making process, and agree with plan.     Elwyn Lade, PA-C 07/19/13 1304

## 2013-07-18 NOTE — ED Notes (Signed)
Pt in via EMS c/o near syncopal episode while sitting at a desk, states he went to stand up and he noted some blurred vision, felt dizzy and then felt like he was going to pass out, states he never completely passed out, vision returned to normal while on the phone with EMS, upon EMS arrival pt initial HR was 32 BPM- increased rate during transport to 60-70 BPM and maintained at that rate, pt was diaphoretic upon EMS arrival, dry upon arrival to ED, denies chest pain at any point. IV started PTA. Denies any symptoms of dizziness or complaints at this time.

## 2013-07-19 ENCOUNTER — Encounter (HOSPITAL_COMMUNITY): Payer: Self-pay | Admitting: Internal Medicine

## 2013-07-19 ENCOUNTER — Observation Stay (HOSPITAL_COMMUNITY): Payer: Non-veteran care

## 2013-07-19 DIAGNOSIS — I498 Other specified cardiac arrhythmias: Secondary | ICD-10-CM

## 2013-07-19 DIAGNOSIS — I2510 Atherosclerotic heart disease of native coronary artery without angina pectoris: Secondary | ICD-10-CM | POA: Diagnosis present

## 2013-07-19 DIAGNOSIS — R001 Bradycardia, unspecified: Secondary | ICD-10-CM | POA: Diagnosis present

## 2013-07-19 DIAGNOSIS — E785 Hyperlipidemia, unspecified: Secondary | ICD-10-CM | POA: Diagnosis present

## 2013-07-19 DIAGNOSIS — I059 Rheumatic mitral valve disease, unspecified: Secondary | ICD-10-CM

## 2013-07-19 DIAGNOSIS — E782 Mixed hyperlipidemia: Secondary | ICD-10-CM | POA: Diagnosis present

## 2013-07-19 DIAGNOSIS — R55 Syncope and collapse: Secondary | ICD-10-CM

## 2013-07-19 DIAGNOSIS — I10 Essential (primary) hypertension: Secondary | ICD-10-CM

## 2013-07-19 LAB — CBC
HEMATOCRIT: 37.4 % — AB (ref 39.0–52.0)
Hemoglobin: 13.3 g/dL (ref 13.0–17.0)
MCH: 33.9 pg (ref 26.0–34.0)
MCHC: 35.6 g/dL (ref 30.0–36.0)
MCV: 95.4 fL (ref 78.0–100.0)
Platelets: 213 10*3/uL (ref 150–400)
RBC: 3.92 MIL/uL — ABNORMAL LOW (ref 4.22–5.81)
RDW: 12.3 % (ref 11.5–15.5)
WBC: 6.5 10*3/uL (ref 4.0–10.5)

## 2013-07-19 LAB — COMPREHENSIVE METABOLIC PANEL
ALT: 19 U/L (ref 0–53)
AST: 23 U/L (ref 0–37)
Albumin: 3.6 g/dL (ref 3.5–5.2)
Alkaline Phosphatase: 52 U/L (ref 39–117)
BUN: 12 mg/dL (ref 6–23)
CO2: 25 mEq/L (ref 19–32)
Calcium: 9.7 mg/dL (ref 8.4–10.5)
Chloride: 103 mEq/L (ref 96–112)
Creatinine, Ser: 0.72 mg/dL (ref 0.50–1.35)
GFR calc Af Amer: >90 mL/min (ref >90)
GFR calc non Af Amer: >90 mL/min (ref >90)
Glucose, Bld: 95 mg/dL (ref 70–99)
POTASSIUM: 3.9 meq/L (ref 3.7–5.3)
SODIUM: 141 meq/L (ref 137–147)
TOTAL PROTEIN: 6.3 g/dL (ref 6.0–8.3)
Total Bilirubin: 0.5 mg/dL (ref 0.3–1.2)

## 2013-07-19 LAB — CBC WITH DIFFERENTIAL/PLATELET
BASOS ABS: 0 10*3/uL (ref 0.0–0.1)
BASOS PCT: 1 % (ref 0–1)
Eosinophils Absolute: 0.5 10*3/uL (ref 0.0–0.7)
Eosinophils Relative: 8 % — ABNORMAL HIGH (ref 0–5)
HEMATOCRIT: 37 % — AB (ref 39.0–52.0)
Hemoglobin: 13.2 g/dL (ref 13.0–17.0)
Lymphocytes Relative: 37 % (ref 12–46)
Lymphs Abs: 2.3 10*3/uL (ref 0.7–4.0)
MCH: 34 pg (ref 26.0–34.0)
MCHC: 35.7 g/dL (ref 30.0–36.0)
MCV: 95.4 fL (ref 78.0–100.0)
MONO ABS: 0.4 10*3/uL (ref 0.1–1.0)
Monocytes Relative: 7 % (ref 3–12)
Neutro Abs: 2.9 10*3/uL (ref 1.7–7.7)
Neutrophils Relative %: 48 % (ref 43–77)
Platelets: 213 10*3/uL (ref 150–400)
RBC: 3.88 MIL/uL — ABNORMAL LOW (ref 4.22–5.81)
RDW: 12.4 % (ref 11.5–15.5)
WBC: 6.1 10*3/uL (ref 4.0–10.5)

## 2013-07-19 LAB — TROPONIN I
Troponin I: <0.3 ng/mL (ref <0.30)
Troponin I: <0.3 ng/mL (ref <0.30)

## 2013-07-19 LAB — CREATININE, SERUM
Creatinine, Ser: 0.75 mg/dL (ref 0.50–1.35)
GFR calc Af Amer: >90 mL/min (ref >90)

## 2013-07-19 LAB — TSH: TSH: 1.458 u[IU]/mL (ref 0.350–4.500)

## 2013-07-19 MED ORDER — SIMVASTATIN 20 MG PO TABS
20.0000 mg | ORAL_TABLET | Freq: Every day | ORAL | Status: DC
  Administered 2013-07-19: 20 mg via ORAL
  Filled 2013-07-19 (×2): qty 1

## 2013-07-19 MED ORDER — ASPIRIN EC 81 MG PO TBEC
81.0000 mg | DELAYED_RELEASE_TABLET | Freq: Every day | ORAL | Status: DC
  Administered 2013-07-19 – 2013-07-20 (×2): 81 mg via ORAL
  Filled 2013-07-19 (×2): qty 1

## 2013-07-19 MED ORDER — SODIUM CHLORIDE 0.9 % IV SOLN
INTRAVENOUS | Status: DC
  Administered 2013-07-19: 03:00:00 via INTRAVENOUS

## 2013-07-19 MED ORDER — ENOXAPARIN SODIUM 40 MG/0.4ML ~~LOC~~ SOLN
40.0000 mg | SUBCUTANEOUS | Status: DC
  Administered 2013-07-19: 40 mg via SUBCUTANEOUS
  Filled 2013-07-19 (×2): qty 0.4

## 2013-07-19 MED ORDER — IRBESARTAN 75 MG PO TABS
37.5000 mg | ORAL_TABLET | Freq: Two times a day (BID) | ORAL | Status: DC
  Administered 2013-07-19 – 2013-07-20 (×4): 37.5 mg via ORAL
  Filled 2013-07-19 (×5): qty 0.5

## 2013-07-19 MED ORDER — ACETAMINOPHEN 325 MG PO TABS: 650.0000 mg | ORAL_TABLET | ORAL | Status: DC | PRN

## 2013-07-19 MED ORDER — IOHEXOL 350 MG/ML SOLN
100.0000 mL | Freq: Once | INTRAVENOUS | Status: AC | PRN
Start: 2013-07-19 — End: 2013-07-19
  Administered 2013-07-19: 100 mL via INTRAVENOUS

## 2013-07-19 NOTE — Progress Notes (Addendum)
Patient seen and examined this morning. Agree with H&P. His presentation concerning for cardiac event, unlikely primary neurologic event. Continue to monitor on telemetry, no events overnight. I have kindly consulted cardiology this morning, appreciate their input. 2-D echo pending, MRI pending.  Jaylena Holloway M. Cruzita Lederer, MD [facility] [phone]

## 2013-07-19 NOTE — Progress Notes (Signed)
  Echocardiogram 2D Echocardiogram has been performed.  Mauricio Po 07/19/2013, 4:31 PM

## 2013-07-19 NOTE — Progress Notes (Signed)
UR Completed.  Vergie Living [phone] 07/19/2013

## 2013-07-19 NOTE — Progress Notes (Signed)
  Echo with LVEF 50%. RV dilated and hypokinetic.   Will get CT chest to exclude PE.   Chrishun Scheer,MD 5:08 PM

## 2013-07-19 NOTE — Consult Note (Signed)
CARDIOLOGY CONSULT NOTE   Patient ID: Richard Camacho MRN: [id number] DOB/AGE: [birth year] 68 y.o.  Admit date: 07/18/2013  Primary Physician   No PCP Per Patient Primary [place] Reason for Consultation  Pre-syncope  HPI: Richard Camacho is a 68 y.o. male with a history of HTN, HLD, OSA on CPAP, endocarditis in 2001 and CAD s/p CABG x3 2012 at [facility] who presented to [facility] last night after an episode of pre-syncope at work Tax [employer]). The patient had been in his usual state of health until yesterday afternoon when he started to feel "swimmy headed". He reports that he started imagining hearing people slurr their words and seeing the letters on the computer screen "jumping around." He was sitting at his desk when he became acutely short of breath, lightheaded and dizzy. He felt like he was "bottoming out," suffocating and had a sense of impending doom. He denies chest pain, diaphoresis, palpitations, nausea/vomiting, abdominal pain or frank syncope. He was able to call EMS and walked out to the parking lot where he nearly collapsed. Per EMS reports, his heart rate was 32 bpm which increased to 60-70 bpm during transit. He reports that he felt normal within an hour of the episode and has felt well ever since. He does note that about 1-2 months ago he had a flashback of waking up while still intubated immediately following his  bypass surgery. He cannot tell me if he passed out during this episode or if it was an actual flashback or not? However; he had the same acute onset of SOB and feeling of suffocation that he had last night. He does report that he has had increased stress lately with his job. He denies any recent chest pain, indigestion, exertional shortness of breath. No recent fevers, chills or night sweats. He had stress testing done about one year ago which returned to normal. He is very active and swims and walks almost everyday. He has not noted any worsening SOB  or trouble with his usual activities. He was followed at [facility] for cardiac rehab until a year ago. He is compliant with all of his medications and has not started on any new medications.    Past Medical History  Diagnosis Date  . Hypertension   . High cholesterol   . Coronary artery disease      Past Surgical History  Procedure Laterality Date  . Cardiac surgery    . Coronary artery bypass graft    . Modified radical mastectomy    . Mohs surgery      Allergies  Allergen Reactions  . Ramipril Other (See Comments) and Cough    "Cough and other"    I have reviewed the patient's current medications . aspirin EC  81 mg Oral Daily  . enoxaparin (LOVENOX) injection  40 mg Subcutaneous Q24H  . irbesartan  37.5 mg Oral BID  . simvastatin  20 mg Oral q1800   . sodium chloride 75 mL/hr at 07/19/13 0246   acetaminophen  Prior to Admission medications   Medication Sig Start Date End Date Taking? Authorizing Provider  aspirin EC 81 MG tablet Take 81 mg by mouth daily.    Yes Historical Provider, MD  cetirizine (ZYRTEC) 10 MG tablet Take 10 mg by mouth daily.   Yes Historical Provider, MD  Cholecalciferol (VITAMIN D) 2000 UNITS tablet Take 2,000 Units by mouth daily.   Yes Historical Provider, MD  DOXYCYCLINE PO Take 1  tablet by mouth 2 (two) times daily.   Yes Historical Provider, MD  metoprolol tartrate (LOPRESSOR) 25 MG tablet Take 12.5 mg by mouth 2 (two) times daily.   Yes Historical Provider, MD  Multiple Vitamins-Minerals (MULTIVITAMIN WITH MINERALS) tablet Take 1 tablet by mouth daily.   Yes Historical Provider, MD  PRESCRIPTION MEDICATION Apply 1 application topically 2 (two) times daily. Cream for rosacea to face twice daily. metrocream or metrogel   Yes Historical Provider, MD  simvastatin (ZOCOR) 40 MG tablet Take 20 mg by mouth daily at 6 PM.   Yes Historical Provider, MD  valsartan (DIOVAN) 40 MG tablet Take 20 mg by mouth 2 (two) times daily.   Yes Historical Provider, MD    vitamin E 400 UNIT capsule Take 400 Units by mouth daily.   Yes Historical Provider, MD     History   Social History  . Marital Status: Married    Spouse Name: N/A    Number of Children: N/A  . Years of Education: N/A   Occupational History  . Not on file.   Social History Main Topics  . Smoking status: Never Smoker   . Smokeless tobacco: Not on file  . Alcohol Use: Yes     Comment: One drink beer thrice a week.  . Drug Use: Not on file  . Sexual Activity: Not on file   Other Topics Concern  . Not on file   Social History Narrative  . No narrative on file    No family status information on file.   Family History  Problem Relation Age of Onset  . CAD Father   . Bladder Cancer Father      ROS:  Full 14 point review of systems complete and found to be negative unless listed above.  Physical Exam: Blood pressure 150/72, pulse 57, temperature 97.7 F (36.5 C), temperature source Oral, resp. rate 16, height 5\' 11"  (1.803 m), weight 216 lb 0.8 oz (98 kg), SpO2 97.00%.  General: Well developed, well nourished, male in no acute distress Head: Eyes PERRLA, No xanthomas.   Normocephalic and atraumatic, oropharynx without edema or exudate. Dentition:  Lungs: CTAB Heart: HRRR S1 S2, no rub/gallop, Heart irregular rate and rhythm with S1, S2  murmur. pulses are 2+ extrem.   Neck: No carotid bruits. No lymphadenopathy. No JVD. Abdomen: Bowel sounds present, abdomen soft and non-tender without masses or hernias noted. Msk:  No spine or cva tenderness. No weakness, no joint deformities or effusions. Extremities: No clubbing or cyanosis.  edema.  Neuro: Alert and oriented X 3. No focal deficits noted. Psych:  Good affect, responds appropriately Skin: No rashes or lesions noted.  Labs:   Lab Results  Component Value Date   WBC 6.1 07/19/2013   WBC 6.5 07/19/2013   HGB 13.2 07/19/2013   HGB 13.3 07/19/2013   HCT 37.0* 07/19/2013   HCT 37.4* 07/19/2013   MCV 95.4 07/19/2013    MCV 95.4 07/19/2013   PLT 213 07/19/2013   PLT 213 07/19/2013    Recent Labs  07/18/13 1936  INR 0.84    Recent Labs Lab 07/19/13 0305  NA 141  K 3.9  CL 103  CO2 25  BUN 12  CREATININE 0.72  0.75  CALCIUM 9.7  PROT 6.3  BILITOT 0.5  ALKPHOS 52  ALT 19  AST 23  GLUCOSE 95  ALBUMIN 3.6     Recent Labs  07/19/13 0305 07/19/13 0742  TROPONINI <0.30 <0.30    Recent  Labs  07/18/13 2001  TROPIPOC 0.01   TSH  Date/Time Value Ref Range Status  07/19/2013  3:05 AM 1.458  0.350 - 4.500 uIU/mL Final     Performed at [facility]    Echo: pending  ECG:  RAD, sinus bradycardia  Radiology:  Ct Head Wo Contrast  07/18/2013   CLINICAL DATA:  Near syncope.  EXAM: CT HEAD WITHOUT CONTRAST  TECHNIQUE: Contiguous axial images were obtained from the base of the skull through the vertex without intravenous contrast.  COMPARISON:  None.  FINDINGS: The ventricles and sulci are within normal limits for age. There is no evidence of acute infarct, intracranial hemorrhage, mass, midline shift, or extra-axial collection. The orbits are unremarkable. The mastoid air cells are clear. There is mild right anterior ethmoid air cell mucosal thickening. There is a right maxillary sinus mucosal thickening with a likely mucous retention cyst. There is no evidence of acute fracture.  IMPRESSION: No evidence of acute intracranial abnormality.   Electronically Signed   By: Logan Bores   On: 07/18/2013 20:22    ASSESSMENT AND PLAN:    Principal Problem:   Near syncope Active Problems:   CAD (coronary artery disease)   HTN (hypertension)   Bradycardia   Hyperlipidemia  Richard Camacho is a 68 y.o. male with a history of HTN, HLD, OSA on CPAP, endocarditis in 2001 and CAD s/p CABG x3 2012 at [facility] who presented to [facility] last night after an episode of pre-syncope at work.  Presyncope- cardiac vs neurologic vs panic disorder?  -- Could be vasovagal or due to sinus node dysfunction.  Found to be bradycardic into 30s upon EMS arrival . Telemetry with sinus brady 40-60s. Few PVCs. ECHO pending, hold BB -- Symptoms of hearing slurred speech and movie words on computer screen. CT head did not show any acute findings. MRI pending. -- Increased stress at work; admits to flash backs of being intubated and suffocating? PTSD?  CAD- s/p CABGx3 2012. Recent normal stress test at [facility] per patient report. No chest pain. Troponin x 3 negative. ECG with no acute ST or TW changes.  -- Cont ASA, hold BB with bradycardia and pre-syncope  HTN - mildly elevated; stable. Continue ARB  HLD- continue statin   Signed: Ranae Plumber 07/19/2013 12:56 PM  Pager [phone]  Co-Sign MD  Patient seen and examined with Perry Mount, PA-C. We discussed all aspects of the encounter. I agree with the assessment and plan as stated above.   Suspect symptoms due to symptomatic bradycardia.No evidence of ACS. Recent stress test normal.  Agree with stopping metoprolol. Will do echo. If echo normal can be discharged with f/u at [facility]. Have recommended 30-day event monitor which he will obtain at [facility].   Micaela Stith,MD 2:27 PM

## 2013-07-19 NOTE — H&P (Signed)
[facility] History and Physical  Richard Camacho GYI:[id number] DOB: Jan 17, 1946 DOA: 07/18/2013  Referring physician: ER physician. PCP: No PCP Per Patient [name].  Chief Complaint: Dizziness.  HPI: Richard Camacho is a 69 y.o. male with history of CAD status post CABG, hypertension and hyperlipidemia started to experience blurred vision and dizziness at around 6 PM. Patient also felt increasing effort to walk. Denies any loss of consciousness but felt as if patient was about to lose consciousness. Denies chest pain or shortness of breath. Patient called the EMS. As per the information provided to me by the ER physician patient was found to be bradycardic at pulse rate around 30 beats per minute when EMS arrived. Patient's heart rate improved spontaneously. Patient also stated prior to the episode patient was hearing people with slurred voice. Patient did not have any headache or any focal deficits on arrival. EKG shows normal sinus rhythm with heart rate around 60 beats per minute. CT head did not show any acute. Patient has been admitted for further workup. Patient denies having taken any new medications. Patient is on metoprolol for many years.   Review of Systems: As presented in the history of presenting illness, rest negative.  Past Medical History  Diagnosis Date  . Hypertension   . High cholesterol   . Coronary artery disease    Past Surgical History  Procedure Laterality Date  . Cardiac surgery    . Coronary artery bypass graft    . Modified radical mastectomy    . Mohs surgery     Social History:  reports that he has never smoked. He does not have any smokeless tobacco history on file. He reports that he drinks alcohol. His drug history is not on file. Where does patient live home. Can patient participate in ADLs? Yes.  Allergies  Allergen Reactions  . Ramipril Other (See Comments) and Cough    "Cough and other"    Family History:  Family History   Problem Relation Age of Onset  . CAD Father   . Bladder Cancer Father       Prior to Admission medications   Medication Sig Start Date End Date Taking? Authorizing Provider  aspirin EC 81 MG tablet Take 81 mg by mouth daily.    Yes Historical Provider, MD  cetirizine (ZYRTEC) 10 MG tablet Take 10 mg by mouth daily.   Yes Historical Provider, MD  Cholecalciferol (VITAMIN D) 2000 UNITS tablet Take 2,000 Units by mouth daily.   Yes Historical Provider, MD  DOXYCYCLINE PO Take 1 tablet by mouth 2 (two) times daily.   Yes Historical Provider, MD  metoprolol tartrate (LOPRESSOR) 25 MG tablet Take 12.5 mg by mouth 2 (two) times daily.   Yes Historical Provider, MD  Multiple Vitamins-Minerals (MULTIVITAMIN WITH MINERALS) tablet Take 1 tablet by mouth daily.   Yes Historical Provider, MD  PRESCRIPTION MEDICATION Apply 1 application topically 2 (two) times daily. Cream for rosacea to face twice daily. metrocream or metrogel   Yes Historical Provider, MD  simvastatin (ZOCOR) 40 MG tablet Take 20 mg by mouth daily at 6 PM.   Yes Historical Provider, MD  valsartan (DIOVAN) 40 MG tablet Take 20 mg by mouth 2 (two) times daily.   Yes Historical Provider, MD  vitamin E 400 UNIT capsule Take 400 Units by mouth daily.   Yes Historical Provider, MD    Physical Exam: Filed Vitals:   07/18/13 2315 07/18/13 2330 07/19/13 0000 07/19/13 0025  BP: 140/68  135/65 144/63 143/55  Pulse: 51 50 75 50  Temp:    97.8 F (36.6 C)  TempSrc:    Oral  Resp: 16 19 19 20   Height:    5\' 11"  (1.803 m)  Weight:    98 kg (216 lb 0.8 oz)  SpO2: 96% 94% 95% 97%     General:  Well-developed and nourished.  Eyes: Anicteric no pallor.  ENT: No discharge from ears eyes nose mouth.  Neck: No mass felt.  Cardiovascular: S1-S2 heard.  Respiratory: No rhonchi or crepitations.  Abdomen: Soft nontender bowel sounds present. No guarding or rigidity.  Skin: No rash.  Musculoskeletal: No edema.  Psychiatric: Appears  normal.  Neurologic: Alert awake oriented to time place and person. Moves all extremities.  Labs on Admission:  Basic Metabolic Panel:  Recent Labs Lab 07/18/13 1919 07/18/13 2001  NA 140 141  K 4.2 4.0  CL 103 105  CO2 23  --   GLUCOSE 112* 108*  BUN 13 13  CREATININE 0.70 0.80  CALCIUM 9.9  --    Liver Function Tests:  Recent Labs Lab 07/18/13 1919  AST 25  ALT 20  ALKPHOS 60  BILITOT 0.4  PROT 7.0  ALBUMIN 4.0   No results found for this basename: LIPASE, AMYLASE,  in the last 168 hours No results found for this basename: AMMONIA,  in the last 168 hours CBC:  Recent Labs Lab 07/18/13 1919 07/18/13 2001  WBC 7.3  --   NEUTROABS 3.7  --   HGB 14.3 14.6  HCT 39.3 43.0  MCV 95.6  --   PLT 232  --    Cardiac Enzymes: No results found for this basename: CKTOTAL, CKMB, CKMBINDEX, TROPONINI,  in the last 168 hours  BNP (last 3 results) No results found for this basename: PROBNP,  in the last 8760 hours CBG:  Recent Labs Lab 07/18/13 2105  GLUCAP 100*    Radiological Exams on Admission: Ct Head Wo Contrast  07/18/2013   CLINICAL DATA:  Near syncope.  EXAM: CT HEAD WITHOUT CONTRAST  TECHNIQUE: Contiguous axial images were obtained from the base of the skull through the vertex without intravenous contrast.  COMPARISON:  None.  FINDINGS: The ventricles and sulci are within normal limits for age. There is no evidence of acute infarct, intracranial hemorrhage, mass, midline shift, or extra-axial collection. The orbits are unremarkable. The mastoid air cells are clear. There is mild right anterior ethmoid air cell mucosal thickening. There is a right maxillary sinus mucosal thickening with a likely mucous retention cyst. There is no evidence of acute fracture.  IMPRESSION: No evidence of acute intracranial abnormality.   Electronically Signed   By: Logan Bores   On: 07/18/2013 20:22    EKG: Independently reviewed. Normal sinus rhythm with heart rate around 60  beats per minute. Nonspecific ST-T changes in inferolateral leads. Discussed EKG with cardiologist.  Assessment/Plan Principal Problem:   Near syncope Active Problems:   CAD (coronary artery disease)   HTN (hypertension)   Bradycardia   Hyperlipidemia   1. Near-syncopal episode - most likely secondary to cardiac etiology probably vasovagal or bradycardia. I'm holding off patient's beta blocker for now. Closely monitor in telemetry. Since patient had some blurred vision and hearing patient's colleagues voice is slurred I have ordered MRI brain. May consult cardiology in a.m. Check orthostatics. Check 2-D echo. 2. CAD status post CABG - denies any chest pain. 3. Hypertension - holding off beta blockers due to bradycardia.  4. Hyperlipidemia - continue present medications.  I have discussed patient's EKG with cardiologist on call.  Code Status: Full code.  Family Communication: Patient wife at the bedside.  Disposition Plan: Admit for observation.    Payge Eppes N. [facility] Pager [phone].  If 7PM-7AM, please contact night-coverage [email or web address] Password [facility] 07/19/2013, 12:59 AM

## 2013-07-20 LAB — GLUCOSE, CAPILLARY: Glucose-Capillary: 103 mg/dL — ABNORMAL HIGH (ref 70–99)

## 2013-07-20 NOTE — Progress Notes (Signed)
Pt d/c home.  A &Ox4.  No c/o pain.  Skin intact.  Education given on diet, meds and follow-up care and instructions.  Pt verbalized understanding.  IV d/c'd.

## 2013-07-20 NOTE — Discharge Instructions (Signed)
You were cared for by a hospitalist during your hospital stay. If you have any questions about your discharge medications or the care you received while you were in the hospital after you are discharged, you can call the unit and asked to speak with the hospitalist on call if the hospitalist that took care of you is not available. Once you are discharged, your primary care physician will handle any further medical issues. Please note that NO REFILLS for any discharge medications will be authorized once you are discharged, as it is imperative that you return to your primary care physician (or establish a relationship with a primary care physician if you do not have one) for your aftercare needs so that they can reassess your need for medications and monitor your lab values. °  °  °If you do not have a primary care physician, you can call [phone] for a physician referral. ° °Follow with Primary MD in 5-7 days  ° °Get CBC, CMP checked by your doctor and again as further instructed.  °Get a 2 view Chest X ray done next visit if you had Pneumonia of Lung problems at the Hospital. ° °Get Medicines reviewed and adjusted. ° °Please request your Prim.MD to go over all Hospital Tests and Procedure/Radiological results at the follow up, please get all Hospital records sent to your Prim MD by signing hospital release before you go home. ° °Activity: As tolerated with Full fall precautions use walker/cane & assistance as needed ° °Diet: heart healthy ° °For Heart failure patients - Check your Weight same time everyday, if you gain over 2 pounds, or you develop in leg swelling, experience more shortness of breath or chest pain, call your Primary MD immediately. Follow Cardiac Low Salt Diet and 1.8 lit/day fluid restriction. ° °Disposition Home ° °If you experience worsening of your admission symptoms, develop shortness of breath, life threatening emergency, suicidal or homicidal thoughts you must seek medical attention  immediately by calling 911 or calling your MD immediately  if symptoms less severe. ° °You Must read complete instructions/literature along with all the possible adverse reactions/side effects for all the Medicines you take and that have been prescribed to you. Take any new Medicines after you have completely understood and accpet all the possible adverse reactions/side effects.  ° °Do not drive and provide baby sitting services if your were admitted for syncope or siezures until you have seen by Primary MD or a Neurologist and advised to do so again. ° °Do not drive when taking Pain medications.  ° °Do not take more than prescribed Pain, Sleep and Anxiety Medications ° °Special Instructions: If you have smoked or chewed Tobacco  in the last 2 yrs please stop smoking, stop any regular Alcohol  and or any Recreational drug use. ° °Wear Seat belts while driving. ° °

## 2013-07-20 NOTE — Progress Notes (Signed)
Patient ID: Richard Camacho, male   DOB: Jun 26, 1945, 68 y.o.   MRN: [id number] The echo revealed some right heart dilatation and dysfunction. There is no evidence of pulmonary embolus. No further cardiac workup is needed. Patient can be discharged from cardiology viewpoint and followup with his doctors at the [place].  Daryel November, MD

## 2013-07-20 NOTE — Discharge Summary (Signed)
Physician Discharge Summary  Richard Camacho YWV:[id number] DOB: [birth year] DOA: 07/18/2013   PCP: No PCP Per Patient   Admit date: 07/18/2013 Discharge date: 07/20/2013  Time spent: 35 minutes  Recommendations for Outpatient Follow-up:  1. Follow up with your cardiologist at [facility] in 1 week 2. Follow up with PCP in 1 week   Discharge Diagnoses:  Principal Problem:   Near syncope Active Problems:   CAD (coronary artery disease)   HTN (hypertension)   Bradycardia   Hyperlipidemia  Discharge Condition: stable  Diet recommendation: heart healthy  Filed Weights   07/18/13 1920 07/19/13 0025  Weight: 97.523 kg (215 lb) 98 kg (216 lb 0.8 oz)    History of present illness:  Richard Camacho is a 68 y.o. male with history of CAD status post CABG, hypertension and hyperlipidemia started to experience blurred vision and dizziness at around 6 PM. Patient also felt increasing effort to walk. Denies any loss of consciousness but felt as if patient was about to lose consciousness. Denies chest pain or shortness of breath. Patient called the EMS. As per the information provided to me by the ER physician patient was found to be bradycardic at pulse rate around 30 beats per minute when EMS arrived. Patient's heart rate improved spontaneously. Patient also stated prior to the episode patient was hearing people with slurred voice. Patient did not have any headache or any focal deficits on arrival. EKG shows normal sinus rhythm with heart rate around 60 beats per minute. CT head did not show any acute. Patient has been admitted for further workup. Patient denies having taken any new medications. Patient is on metoprolol for many years.   Hospital Course:  Patient admitted to telemetry floor. Given bradycardia and near syncopal event, cardiology was consulted. Patient underwent a 2 D echo which showed normal LV function but depressed RV and given concern for a PE he had a CTPA which was negative for  PE. He recently had a stress test at the [place] which was reportedly normal. Serial cardiac enzymes normal. Patient's symptoms have resolved in the ED and throughout his stay he was feeling at baseline without lightheadedness or dizziness. Telemetry with heart rate in the 50s without pauses. No further cardiac workup was planned and patient was safe for discharge and he was advised to discontinue his Metoprolol for now. He will follow up with his cardiologist at the [facility] and his PCP. He had a BP monitor at home and I advised him to monitor his BP and heart rate and show the data to his PCP at his next visit.   Procedures:  2D echo  Study Conclusions - Left ventricle: The cavity size was normal. Wall thickness was normal. The estimated ejection fraction was 50%. Wall motion was normal; there were no regional wall motion abnormalities. - Aortic valve: Sclerosis without stenosis. No significant regurgitation. - Mitral valve: Mild regurgitation. - Left atrium: The atrium was moderately to severely dilated. - Right ventricle: The cavity size was mildly to moderately dilated. Systolic function was mildly to moderately reduced. - Right atrium: The atrium was moderately dilated.  Consultations:  Cardiology   Discharge Exam: Filed Vitals:   07/19/13 0928 07/19/13 1355 07/19/13 2146 07/20/13 0439  BP: 150/72 151/64 139/75 111/59  Pulse: 57 55 52 47  Temp: 97.7 F (36.5 C) 97.9 F (36.6 C) 97.8 F (36.6 C) 98 F (36.7 C)  TempSrc: Oral Oral Oral Oral  Resp: 16 18 18 18   Height:  Weight:      SpO2: 97% 96% 94% 97%   General: NAD Cardiovascular: RRR Respiratory: CTA biL  Discharge Instructions    Medication List    STOP taking these medications       metoprolol tartrate 25 MG tablet  Commonly known as:  LOPRESSOR      TAKE these medications       aspirin EC 81 MG tablet  Take 81 mg by mouth daily.     cetirizine 10 MG tablet  Commonly known as:  ZYRTEC  Take 10 mg by mouth daily.       doxycycline 100 MG capsule  Commonly known as:  VIBRAMYCIN  Take 100 mg by mouth 2 (two) times daily.     DOXYCYCLINE PO  Take 1 tablet by mouth 2 (two) times daily.     metroNIDAZOLE 0.75 % cream  Commonly known as:  METROCREAM  Apply 1 application topically 2 (two) times daily.     multivitamin with minerals tablet  Take 1 tablet by mouth daily.     PRESCRIPTION MEDICATION  Apply 1 application topically 2 (two) times daily. Cream for rosacea to face twice daily. metrocream or metrogel     simvastatin 40 MG tablet  Commonly known as:  ZOCOR  Take 20 mg by mouth daily at 6 PM.     valsartan 40 MG tablet  Commonly known as:  DIOVAN  Take 20 mg by mouth 2 (two) times daily.     Vitamin D 2000 UNITS tablet  Take 2,000 Units by mouth daily.     vitamin E 400 UNIT capsule  Take 400 Units by mouth daily.         The results of significant diagnostics from this hospitalization (including imaging, microbiology, ancillary and laboratory) are listed below for reference.    Significant Diagnostic Studies: Ct Head Wo Contrast  07/18/2013   CLINICAL DATA:  Near syncope.  EXAM: CT HEAD WITHOUT CONTRAST  TECHNIQUE: Contiguous axial images were obtained from the base of the skull through the vertex without intravenous contrast.  COMPARISON:  None.  FINDINGS: The ventricles and sulci are within normal limits for age. There is no evidence of acute infarct, intracranial hemorrhage, mass, midline shift, or extra-axial collection. The orbits are unremarkable. The mastoid air cells are clear. There is mild right anterior ethmoid air cell mucosal thickening. There is a right maxillary sinus mucosal thickening with a likely mucous retention cyst. There is no evidence of acute fracture.  IMPRESSION: No evidence of acute intracranial abnormality.   Electronically Signed   By: Logan Bores   On: 07/18/2013 20:22   Ct Angio Chest Pe W/cm &/or Wo Cm  07/19/2013   CLINICAL DATA:  Shortness of  breath, dizziness/syncope, abnormal right ventricle on echo. Evaluate for PE.  EXAM: CT ANGIOGRAPHY CHEST WITH CONTRAST  TECHNIQUE: Multidetector CT imaging of the chest was performed using the standard protocol during bolus administration of intravenous contrast. Multiplanar CT image reconstructions and MIPs were obtained to evaluate the vascular anatomy.  CONTRAST:  15mL OMNIPAQUE IOHEXOL 350 MG/ML SOLN  COMPARISON:  None.  FINDINGS: No evidence of pulmonary embolism.  Lungs are essentially clear. Mild deep and atelectasis in the bilateral lower lobes. No suspicious pulmonary nodules. No pleural effusion or pneumothorax.  Visualized thyroid is unremarkable.  Cardiomegaly. No pericardial effusion. Coronary atherosclerosis. Postsurgical changes related to prior CABG. Atherosclerotic calcifications of the aortic arch.  No suspicious mediastinal, hilar, or axillary lymphadenopathy.  No visualized upper abdomen  is unremarkable.  Mild degenerative changes of the visualized thoracolumbar spine.  Review of the MIP images confirms the above findings.  IMPRESSION: No evidence of pulmonary embolism.  No evidence of acute cardiopulmonary disease.   Electronically Signed   By: Julian Hy M.D.   On: 07/19/2013 18:42   Labs: Basic Metabolic Panel:  Recent Labs Lab 07/18/13 1919 07/18/13 2001 07/19/13 0305  NA 140 141 141  K 4.2 4.0 3.9  CL 103 105 103  CO2 23  --  25  GLUCOSE 112* 108* 95  BUN 13 13 12   CREATININE 0.70 0.80 0.72  0.75  CALCIUM 9.9  --  9.7   Liver Function Tests:  Recent Labs Lab 07/18/13 1919 07/19/13 0305  AST 25 23  ALT 20 19  ALKPHOS 60 52  BILITOT 0.4 0.5  PROT 7.0 6.3  ALBUMIN 4.0 3.6   CBC:  Recent Labs Lab 07/18/13 1919 07/18/13 2001 07/19/13 0305  WBC 7.3  --  6.1  6.5  NEUTROABS 3.7  --  2.9  HGB 14.3 14.6 13.2  13.3  HCT 39.3 43.0 37.0*  37.4*  MCV 95.6  --  95.4  95.4  PLT 232  --  213  213   Cardiac Enzymes:  Recent Labs Lab  07/19/13 0305 07/19/13 0742 07/19/13 1245  TROPONINI <0.30 <0.30 <0.30   CBG:  Recent Labs Lab 07/18/13 2105 07/20/13 1123  GLUCAP 100* 103*   Signed:  GHERGHE, COSTIN  [facility] 07/20/2013, 5:00 PM

## 2013-07-23 NOTE — ED Provider Notes (Signed)
Medical screening examination/treatment/procedure(s) were conducted as a shared visit with non-physician practitioner(s) and myself.  I personally evaluated the patient during the encounter.   EKG Interpretation   Date/Time:  Thursday July 18 2013 19:18:38 EDT Ventricular Rate:  60 PR Interval:  189 QRS Duration: 99 QT Interval:  434 QTC Calculation: 434 R Axis:   -5 Text Interpretation:  Sinus rhythm Abnormal R-wave progression, early  transition Inferior infarct, old No old tracing to compare Confirmed by  [facility]  MD, TREY ([id number]) on 07/18/2013 10:31:26 PM      68 yo male with a few episodes of severe lightheadedness which occurred initially at rest and then as he was trying to walk to the ambulance. Heart rate reportedly in the 30s on initial EMS assessment. On exam, no distress, nontoxic, alert and oriented, heart sounds normal with regular rate and rhythm, lungs clear to auscultation bilaterally. Given elevated risk, and admitted for cardiac evaluation.  Clinical Impression: 1. CAD (coronary artery disease)   2. Bradycardia   3. HTN (hypertension)   4. Near syncope       Houston Siren III, MD 07/23/13 [phone]

## 2013-07-23 NOTE — ED Provider Notes (Signed)
Medical screening examination/treatment/procedure(s) were conducted as a shared visit with non-physician practitioner(s) and myself.  I personally evaluated the patient during the encounter.   EKG Interpretation   Date/Time:  Thursday July 18 2013 19:18:38 EDT Ventricular Rate:  60 PR Interval:  189 QRS Duration: 99 QT Interval:  434 QTC Calculation: 434 R Axis:   -5 Text Interpretation:  Sinus rhythm Abnormal R-wave progression, early  transition Inferior infarct, old No old tracing to compare Confirmed by  [facility]  MD, TREY [phone]) on 07/18/2013 10:31:26 PM        Houston Siren III, MD 07/23/13 [phone]

## 2014-05-30 ENCOUNTER — Encounter (HOSPITAL_COMMUNITY): Payer: Self-pay | Admitting: Nurse Practitioner

## 2014-05-30 ENCOUNTER — Emergency Department (HOSPITAL_COMMUNITY)
Admission: EM | Admit: 2014-05-30 | Discharge: 2014-05-30 | Disposition: A | Discharge status: Home / Self Care | Payer: Non-veteran care | Attending: Emergency Medicine | Admitting: Emergency Medicine

## 2014-05-30 ENCOUNTER — Emergency Department (HOSPITAL_COMMUNITY): Payer: Non-veteran care

## 2014-05-30 DIAGNOSIS — I2510 Atherosclerotic heart disease of native coronary artery without angina pectoris: Secondary | ICD-10-CM | POA: Diagnosis not present

## 2014-05-30 DIAGNOSIS — Z79899 Other long term (current) drug therapy: Secondary | ICD-10-CM | POA: Insufficient documentation

## 2014-05-30 DIAGNOSIS — M25512 Pain in left shoulder: Secondary | ICD-10-CM | POA: Diagnosis present

## 2014-05-30 DIAGNOSIS — M546 Pain in thoracic spine: Secondary | ICD-10-CM | POA: Diagnosis not present

## 2014-05-30 DIAGNOSIS — I10 Essential (primary) hypertension: Secondary | ICD-10-CM | POA: Diagnosis not present

## 2014-05-30 DIAGNOSIS — Z7982 Long term (current) use of aspirin: Secondary | ICD-10-CM | POA: Insufficient documentation

## 2014-05-30 DIAGNOSIS — Z951 Presence of aortocoronary bypass graft: Secondary | ICD-10-CM | POA: Insufficient documentation

## 2014-05-30 DIAGNOSIS — E780 Pure hypercholesterolemia: Secondary | ICD-10-CM | POA: Insufficient documentation

## 2014-05-30 DIAGNOSIS — Z792 Long term (current) use of antibiotics: Secondary | ICD-10-CM | POA: Insufficient documentation

## 2014-05-30 HISTORY — DX: Presence of aortocoronary bypass graft: Z95.1

## 2014-05-30 LAB — BASIC METABOLIC PANEL
ANION GAP: 3 — AB (ref 5–15)
BUN: 10 mg/dL (ref 6–23)
CO2: 30 mmol/L (ref 19–32)
Calcium: 9.8 mg/dL (ref 8.4–10.5)
Chloride: 107 mmol/L (ref 96–112)
Creatinine, Ser: 0.88 mg/dL (ref 0.50–1.35)
GFR, EST NON AFRICAN AMERICAN: 86 mL/min — AB (ref >90)
Glucose, Bld: 100 mg/dL — ABNORMAL HIGH (ref 70–99)
POTASSIUM: 3.8 mmol/L (ref 3.5–5.1)
Sodium: 140 mmol/L (ref 135–145)

## 2014-05-30 LAB — CBC WITH DIFFERENTIAL/PLATELET
BASOS PCT: 1 % (ref 0–1)
Basophils Absolute: 0 10*3/uL (ref 0.0–0.1)
Eosinophils Absolute: 0.3 10*3/uL (ref 0.0–0.7)
Eosinophils Relative: 6 % — ABNORMAL HIGH (ref 0–5)
HCT: 38.2 % — ABNORMAL LOW (ref 39.0–52.0)
Hemoglobin: 13.5 g/dL (ref 13.0–17.0)
Lymphocytes Relative: 26 % (ref 12–46)
Lymphs Abs: 1.4 10*3/uL (ref 0.7–4.0)
MCH: 33.8 pg (ref 26.0–34.0)
MCHC: 35.3 g/dL (ref 30.0–36.0)
MCV: 95.5 fL (ref 78.0–100.0)
Monocytes Absolute: 0.5 10*3/uL (ref 0.1–1.0)
Monocytes Relative: 9 % (ref 3–12)
Neutro Abs: 3.2 10*3/uL (ref 1.7–7.7)
Neutrophils Relative %: 58 % (ref 43–77)
PLATELETS: 198 10*3/uL (ref 150–400)
RBC: 4 MIL/uL — AB (ref 4.22–5.81)
RDW: 12.2 % (ref 11.5–15.5)
WBC: 5.5 10*3/uL (ref 4.0–10.5)

## 2014-05-30 LAB — I-STAT TROPONIN, ED: Troponin i, poc: 0 ng/mL (ref 0.00–0.08)

## 2014-05-30 MED ORDER — IBUPROFEN 400 MG PO TABS
600.0000 mg | ORAL_TABLET | Freq: Once | ORAL | Status: AC
  Administered 2014-05-30: 600 mg via ORAL
  Filled 2014-05-30 (×2): qty 1

## 2014-05-30 NOTE — ED Provider Notes (Signed)
CSN: [id number]     Arrival date & time 05/30/14  1005 History   First MD Initiated Contact with Patient 05/30/14 1011     Chief Complaint  Patient presents with  . Shoulder Pain     (Consider location/radiation/quality/duration/timing/severity/associated sxs/prior Treatment) Patient is a 69 y.o. male presenting with shoulder pain and back pain. The history is provided by the patient. No language interpreter was used.  Shoulder Pain Associated symptoms: back pain   Associated symptoms: no fever   Back Pain Pain location: left thoracic. Quality:  Aching Radiates to:  Does not radiate Pain severity:  Moderate Pain is:  Same all the time Onset quality:  Sudden Duration:  6 hours Timing:  Constant Progression:  Unchanged Chronicity:  New Relieved by:  Nothing Worsened by:  Deep breathing and coughing Ineffective treatments:  None tried Associated symptoms: no abdominal pain, no chest pain, no fever, no numbness and no weakness     Past Medical History  Diagnosis Date  . Hypertension   . High cholesterol   . Coronary artery disease   . Hx of CABG    Past Surgical History  Procedure Laterality Date  . Cardiac surgery    . Coronary artery bypass graft    . Modified radical mastectomy    . Mohs surgery     Family History  Problem Relation Age of Onset  . CAD Father   . Bladder Cancer Father    History  Substance Use Topics  . Smoking status: Never Smoker   . Smokeless tobacco: Not on file  . Alcohol Use: Yes     Comment: One drink beer thrice a week.    Review of Systems  Constitutional: Negative for fever.  Respiratory: Negative for cough and shortness of breath.   Cardiovascular: Negative for chest pain.  Gastrointestinal: Negative for nausea, vomiting and abdominal pain.  Musculoskeletal: Positive for back pain.  Neurological: Negative for weakness and numbness.  All other systems reviewed and are negative.     Allergies  Ramipril  Home Medications    Prior to Admission medications   Medication Sig Start Date End Date Taking? Authorizing Provider  aspirin EC 81 MG tablet Take 81 mg by mouth daily.    Yes Historical Provider, MD  cetirizine (ZYRTEC) 10 MG tablet Take 10 mg by mouth daily.   Yes Historical Provider, MD  Cholecalciferol (VITAMIN D) 2000 UNITS tablet Take 2,000 Units by mouth daily.   Yes Historical Provider, MD  Multiple Vitamins-Minerals (MULTIVITAMIN WITH MINERALS) tablet Take 1 tablet by mouth daily.   Yes Historical Provider, MD  Omega-3 Fatty Acids (FISH OIL PO) Take 1 tablet by mouth daily.   Yes Historical Provider, MD  simvastatin (ZOCOR) 10 MG tablet Take 10 mg by mouth at bedtime.   Yes Historical Provider, MD  valsartan (DIOVAN) 160 MG tablet Take 160 mg by mouth daily.   Yes Historical Provider, MD  vitamin E 400 UNIT capsule Take 400 Units by mouth daily.   Yes Historical Provider, MD  doxycycline (VIBRAMYCIN) 100 MG capsule Take 100 mg by mouth 2 (two) times daily.    Historical Provider, MD  DOXYCYCLINE PO Take 1 tablet by mouth 2 (two) times daily.    Historical Provider, MD   BP 152/85 mmHg  Pulse 54  Resp 15  SpO2 97% Physical Exam  Constitutional: He is oriented to person, place, and time. He appears well-developed and well-nourished. No distress.  HENT:  Head: Normocephalic and atraumatic.  Eyes:  Pupils are equal, round, and reactive to light.  Neck: Normal range of motion.  Cardiovascular: Normal rate, regular rhythm and normal heart sounds.   Pulmonary/Chest: Effort normal and breath sounds normal. No respiratory distress. He has no decreased breath sounds. He has no wheezes. He has no rhonchi. He has no rales.  Abdominal: Soft. He exhibits no distension. There is no tenderness. There is no rebound and no guarding.  Musculoskeletal: He exhibits no edema or tenderness.  Neurological: He is alert and oriented to person, place, and time. He exhibits normal muscle tone.  Skin: Skin is warm and dry.   Nursing note and vitals reviewed.   ED Course  Procedures (including critical care time) Labs Review Labs Reviewed  CBC WITH DIFFERENTIAL/PLATELET - Abnormal; Notable for the following:    RBC 4.00 (*)    HCT 38.2 (*)    Eosinophils Relative 6 (*)    All other components within normal limits  BASIC METABOLIC PANEL - Abnormal; Notable for the following:    Glucose, Bld 100 (*)    GFR calc non Af Amer 86 (*)    Anion gap 3 (*)    All other components within normal limits  I-STAT TROPOININ, ED    Imaging Review Dg Chest 2 View  05/30/2014   CLINICAL DATA:  Left shoulder pain, back pain, status post CABG  EXAM: CHEST  2 VIEW  COMPARISON:  CT chest 07/19/2013  FINDINGS: Cardiomediastinal silhouette is stable. Status post CABG. No acute infiltrate or pleural effusion. No pulmonary edema. Minimal degenerative changes thoracic spine.  IMPRESSION: No active cardiopulmonary disease.  Status post CABG.   Electronically Signed   By: Lahoma Crocker M.D.   On: 05/30/2014 11:29     EKG Interpretation None      MDM   Final diagnoses:  Left-sided thoracic back pain   The patient is a 69 year old Caucasian male with pertinent past medical history of coronary artery disease with coronary artery bypass graft several years ago who comes to the emergency department today with left upper thoracic back pain which has been present constantly for the past 6 hours.  Physical exam as above.  Patient has no exertional component to his pain.  He notices the pain more when he breathes or moves.  Initial workup included a troponin, CBC, BMP, chest x-ray, and an EKG.  EKG demonstrated no ST segment elevations or depressions.  Doubt an MI particularly with an atypical story however I feel that a troponin should be obtained to screen for this.  With constant symptoms for 6 hours a single troponin should be sufficient to rule out MI.  With no history of DVT or PE and no shortness of breath I doubt a PE.  CBC is  unremarkable.  BMP was unremarkable.  Chest x-ray demonstrated no consolidations or pneumonia.  There is no evidence of pneumothorax.  Initial troponin was negative.  As a result the patient should be stable for discharge.  He was instructed to follow-up with his primary care physician within a week to ensure he improves and to return to the emergency room with exertional chest pain, shortness of breath, or any other concerns.  The patient expressed understanding.  He was discharged in good condition.  Labs and imaging reviewed by myself and considered in medical decision-making.  Imaging was interpreted by radiology.  Care was discussed with my attending Dr. Jeanell Sparrow.      Katheren Shams, MD 05/30/14 [id number]  Shaune Pollack, MD 06/03/14  2340 

## 2014-05-30 NOTE — ED Notes (Signed)
Phlebotomy at bedside.

## 2014-05-30 NOTE — ED Notes (Signed)
Pt. sts started having left shoulder pain that progressively became worse and was severe at work. Patient came to hospital to visit friends at cardiac rehab and was sent here.

## 2014-05-30 NOTE — ED Notes (Signed)
Pt back from X-ray.  

## 2014-05-30 NOTE — Discharge Instructions (Signed)
Pain of Unknown Etiology (Pain Without a Known Cause) °You have come to your caregiver because of pain. Pain can occur in any part of the body. Often there is not a definite cause. If your laboratory (blood or urine) work was normal and X-rays or other studies were normal, your caregiver may treat you without knowing the cause of the pain. An example of this is the headache. Most headaches are diagnosed by taking a history. This means your caregiver asks you questions about your headaches. Your caregiver determines a treatment based on your answers. Usually testing done for headaches is normal. Often testing is not done unless there is no response to medications. Regardless of where your pain is located today, you can be given medications to make you comfortable. If no physical cause of pain can be found, most cases of pain will gradually leave as suddenly as they came.  °If you have a painful condition and no reason can be found for the pain, it is important that you follow up with your caregiver. If the pain becomes worse or does not go away, it may be necessary to repeat tests and look further for a possible cause. °· Only take over-the-counter or prescription medicines for pain, discomfort, or fever as directed by your caregiver. °· For the protection of your privacy, test results cannot be given over the phone. Make sure you receive the results of your test. Ask how these results are to be obtained if you have not been informed. It is your responsibility to obtain your test results. °· You may continue all activities unless the activities cause more pain. When the pain lessens, it is important to gradually resume normal activities. Resume activities by beginning slowly and gradually increasing the intensity and duration of the activities or exercise. During periods of severe pain, bed rest may be helpful. Lie or sit in any position that is comfortable. °· Ice used for acute (sudden) conditions may be effective.  Use a large plastic bag filled with ice and wrapped in a towel. This may provide pain relief. °· See your caregiver for continued problems. Your caregiver can help or refer you for exercises or physical therapy if necessary. °If you were given medications for your condition, do not drive, operate machinery or power tools, or sign legal documents for 24 hours. Do not drink alcohol, take sleeping pills, or take other medications that may interfere with treatment. °See your caregiver immediately if you have pain that is becoming worse and not relieved by medications. °Document Released: 01/04/2001 Document Revised: 01/30/2013 Document Reviewed: 04/11/2005 °Exit[employer]® Patient Information ©2015 [employer]. This information is not intended to replace advice given to you by your health care provider. Make sure you discuss any questions you have with your health care provider. ° °

## 2015-03-26 IMAGING — CT CT HEAD W/O CM
1 series · 16 of 30 positions shown, 20 images · non-contrast
Comparison: None.

CLINICAL DATA: Near syncope.

EXAM:
CT HEAD WITHOUT CONTRAST
TECHNIQUE: Contiguous axial images were obtained from the base of the skull
through the vertex without intravenous contrast.

[Series 2: head 5.0 h30s · axial · 0.45mm/px · z∈[-76,+79]mm · 16 of 35 slices shown, 20 images]
[im 2/35  brain]
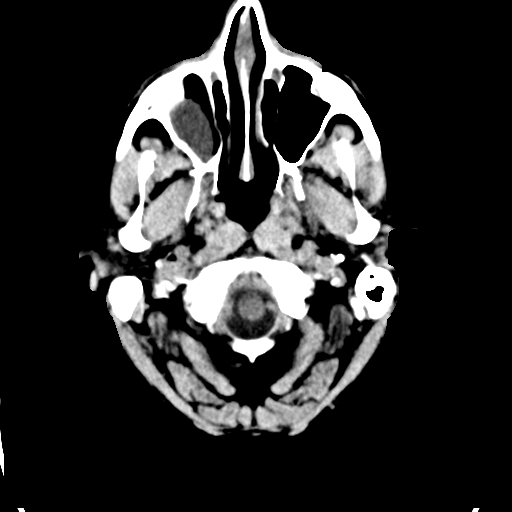
[im 2/35  bone]
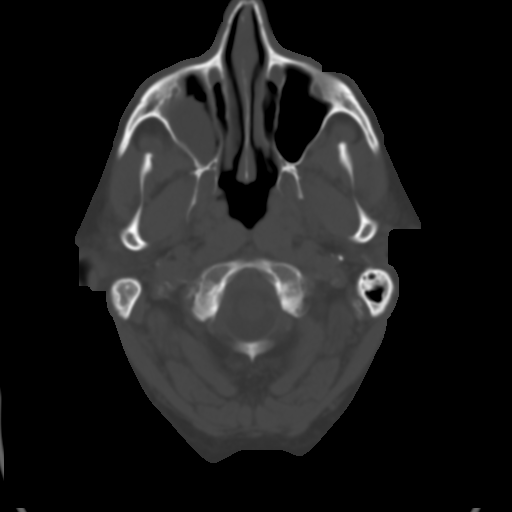
[im 4/35  brain]
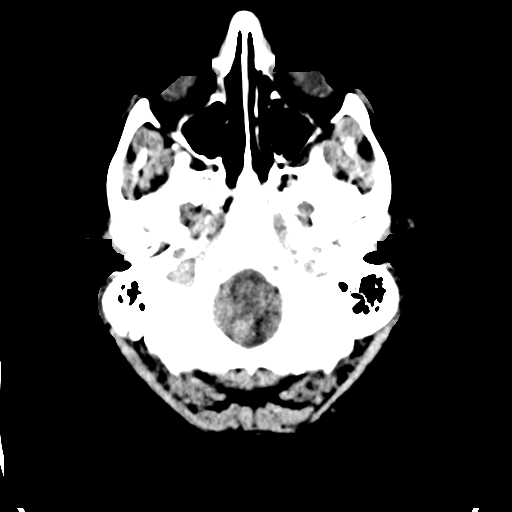
[im 6/35  brain]
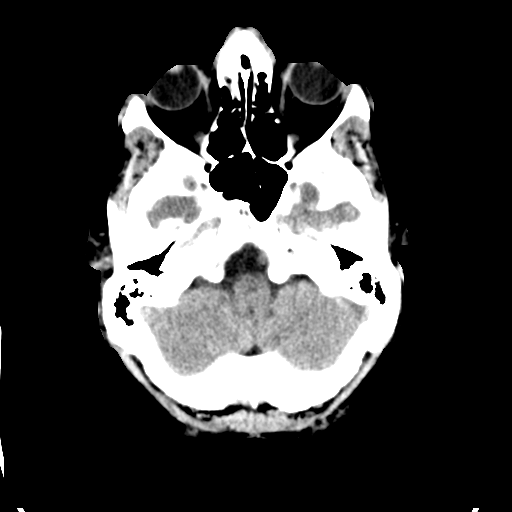
[im 9/35  brain]
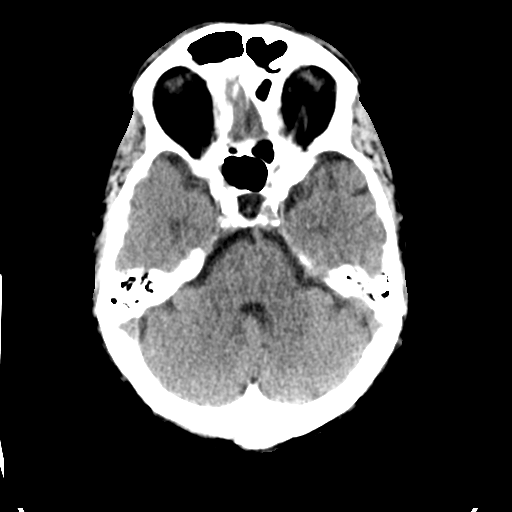
[im 10/35  brain]
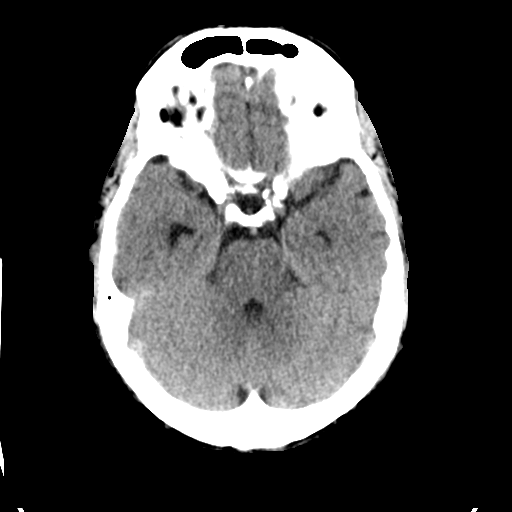
[im 10/35  bone]
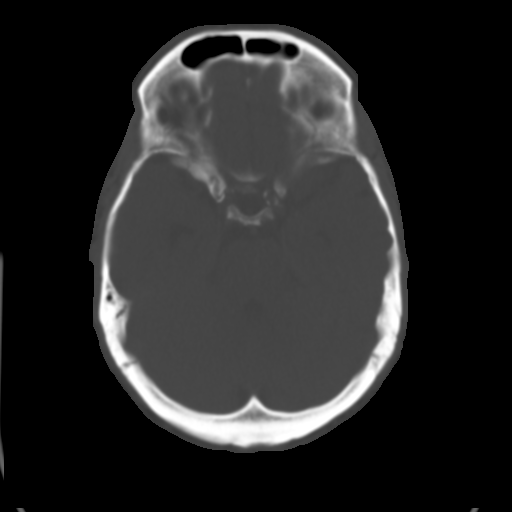
[im 12/35  brain]
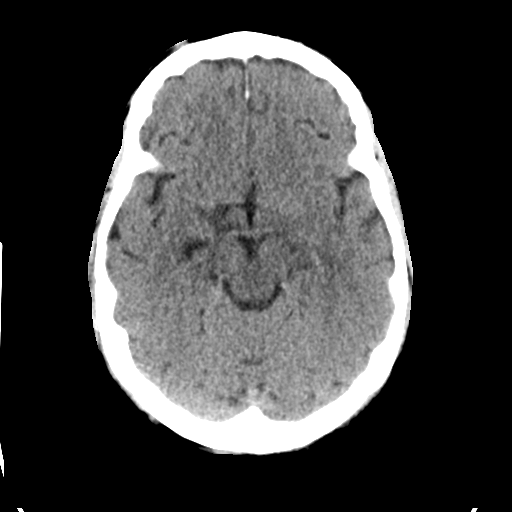
[im 15/35  brain]
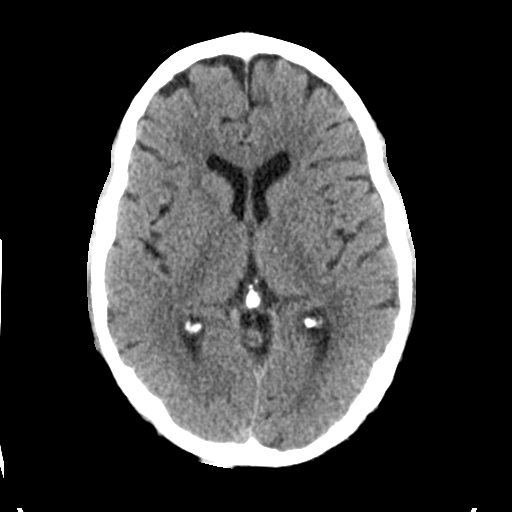
[im 17/35  brain]
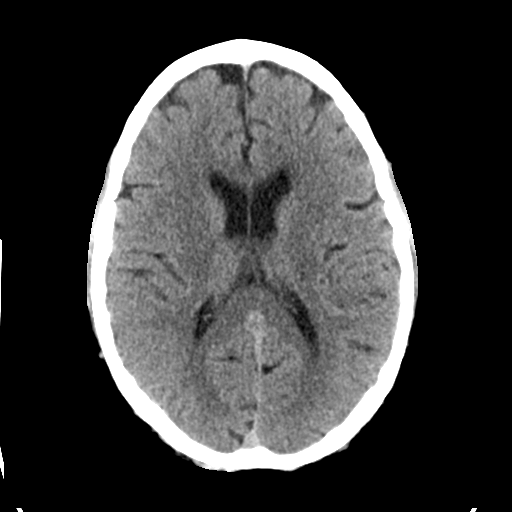
[im 18/35  brain]
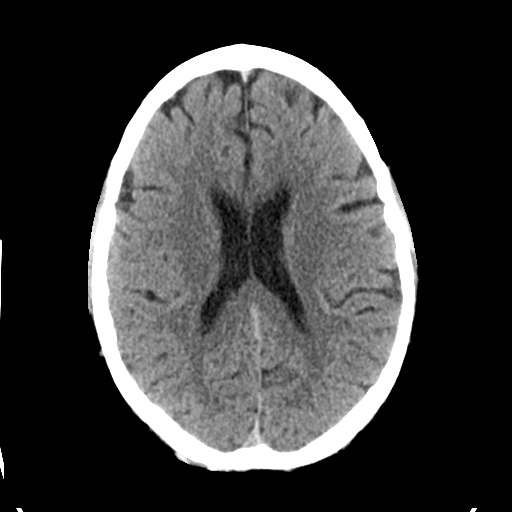
[im 18/35  bone]
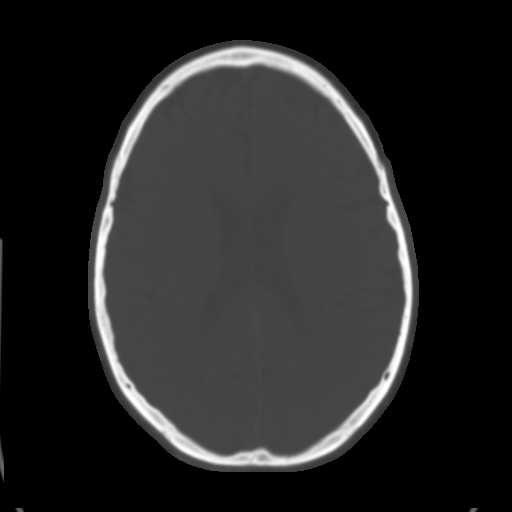
[im 20/35  brain]
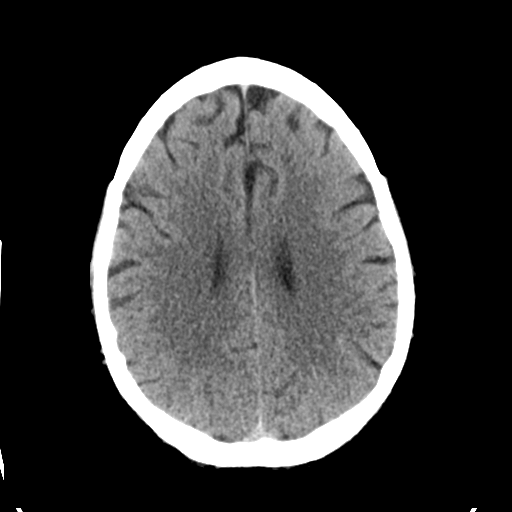
[im 23/35  brain]
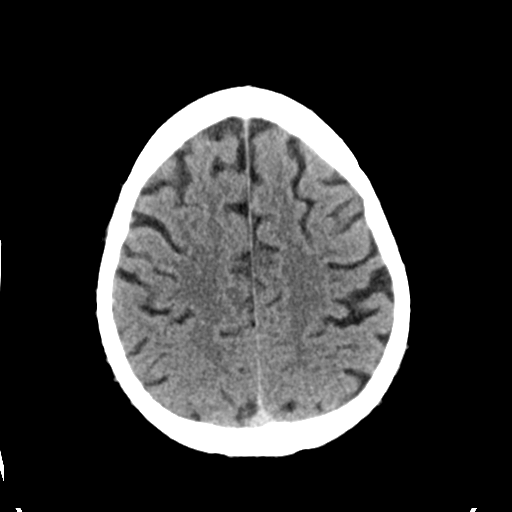
[im 25/35  brain]
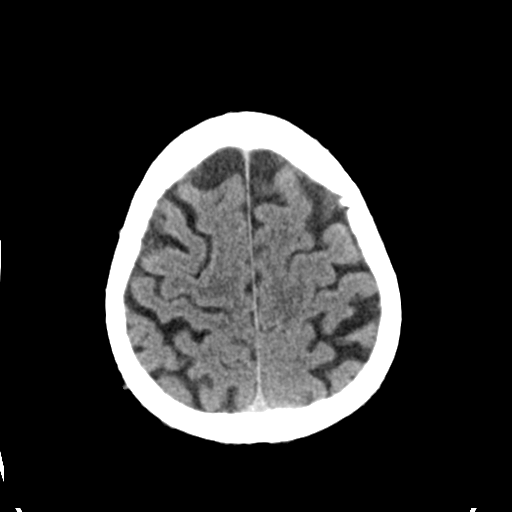
[im 26/35  brain]
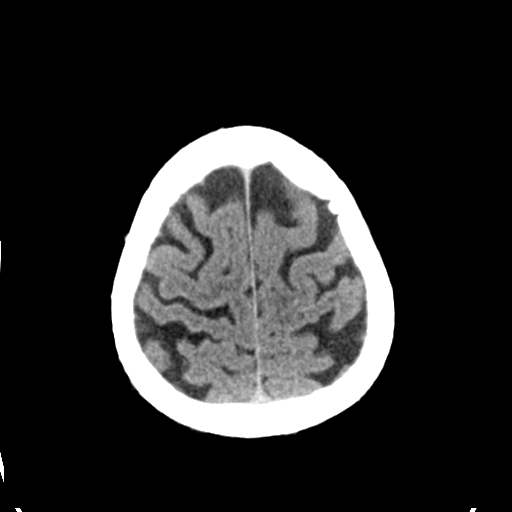
[im 26/35  bone]
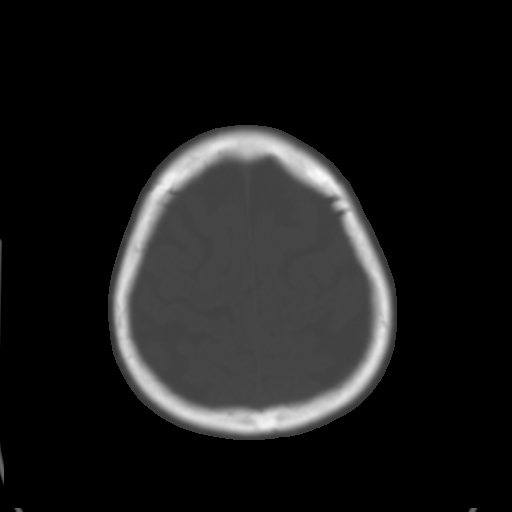
[im 29/35  brain]
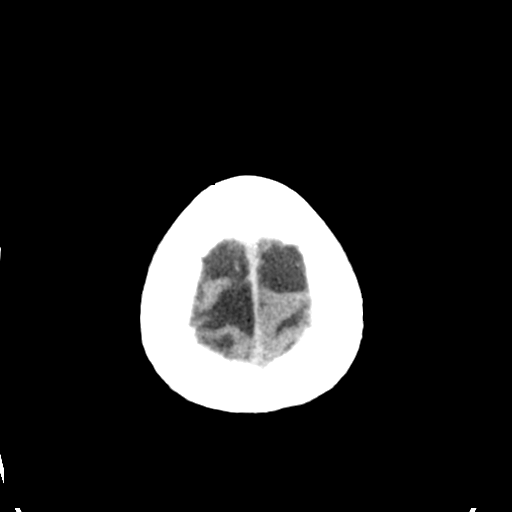
[im 31/35  brain]
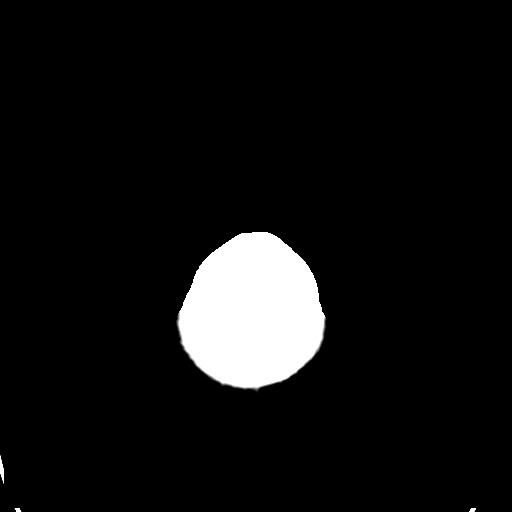
[im 33/35  brain]
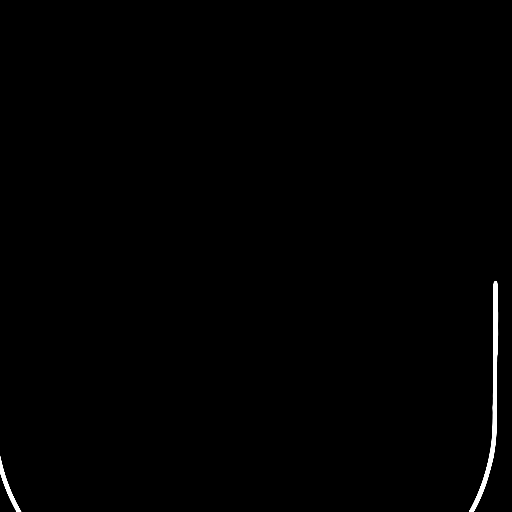

[16 of 30 positions shown; findings below may reference images not displayed]

FINDINGS: The ventricles and sulci are within normal limits for age. There is
no evidence of acute infarct, intracranial hemorrhage, mass, midline
shift, or extra-axial collection. The orbits are unremarkable. The
mastoid air cells are clear. There is mild right anterior ethmoid
air cell mucosal thickening. There is a right maxillary sinus
mucosal thickening with a likely mucous retention cyst. There is no
evidence of acute fracture.
IMPRESSION: No evidence of acute intracranial abnormality.

## 2015-03-27 IMAGING — CT CT ANGIO CHEST
2 of 8 series · 18 of 46 positions shown · IV contrast (APPLIED)
Comparison: None.

CLINICAL DATA: Shortness of breath, dizziness/syncope, abnormal
right ventricle on echo. Evaluate for PE.

EXAM:
CT ANGIOGRAPHY CHEST WITH CONTRAST
TECHNIQUE: Multidetector CT imaging of the chest was performed using the
standard protocol during bolus administration of intravenous
contrast. Multiplanar CT image reconstructions and MIPs were
obtained to evaluate the vascular anatomy.
CONTRAST:  100mL OMNIPAQUE IOHEXOL 350 MG/ML SOLN

[Series 5: thins · axial · 0.80mm/px · z∈[+1322,+1548]mm · 15 of 250 slices shown]
[im 12/250  lung]
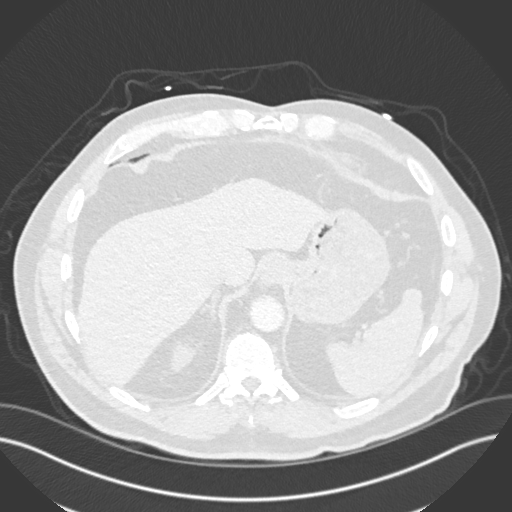
[im 34/250  soft-tissue]
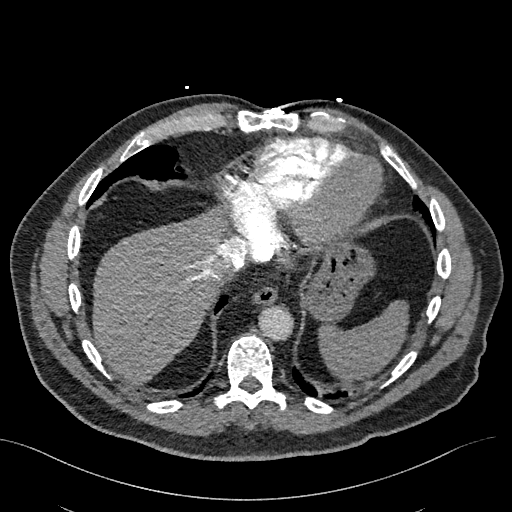
[im 46/250  lung]
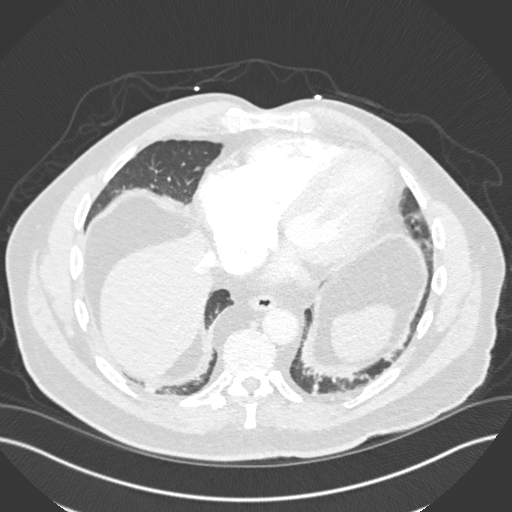
[im 57/250  soft-tissue]
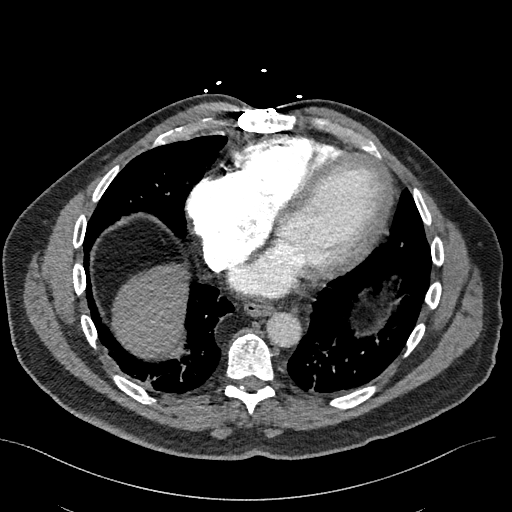
[im 80/250  lung]
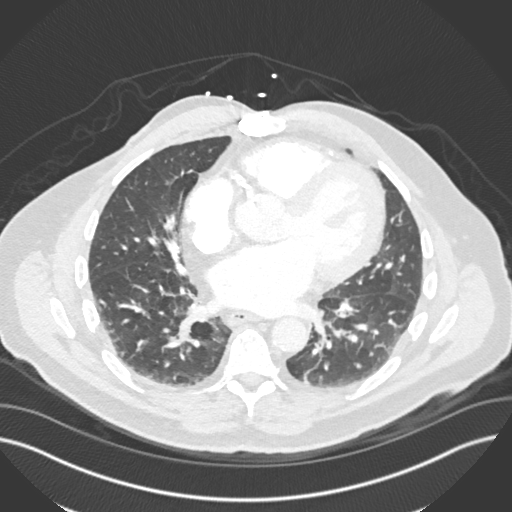
[im 91/250  soft-tissue]
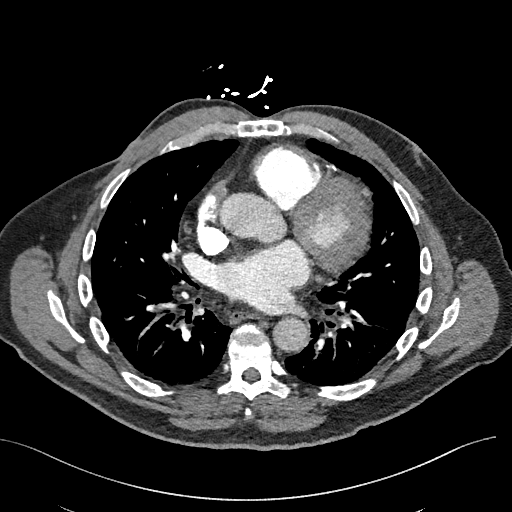
[im 114/250  lung]
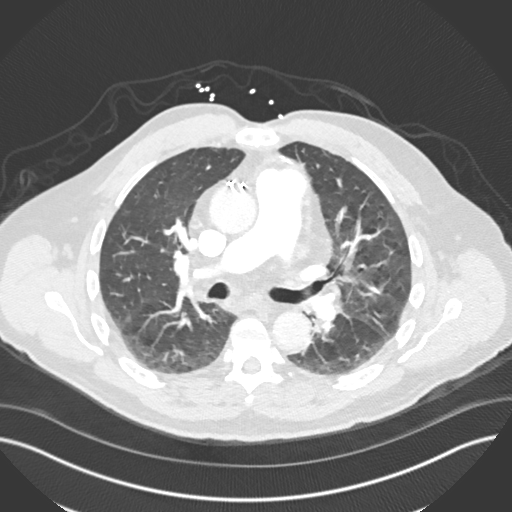
[im 125/250  soft-tissue]
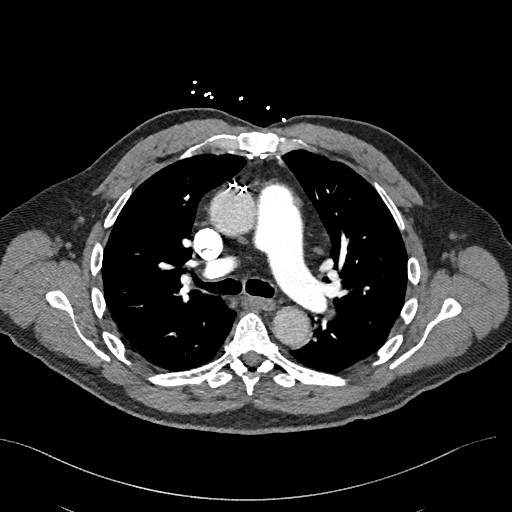
[im 136/250  lung]
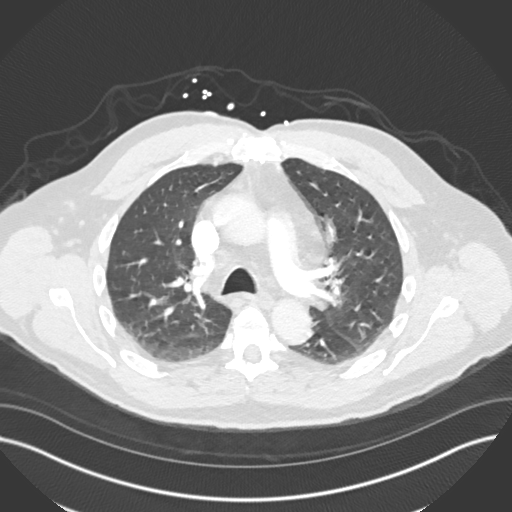
[im 159/250  soft-tissue]
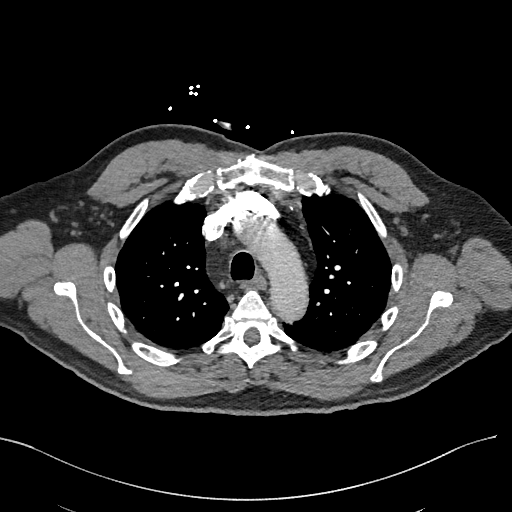
[im 170/250  lung]
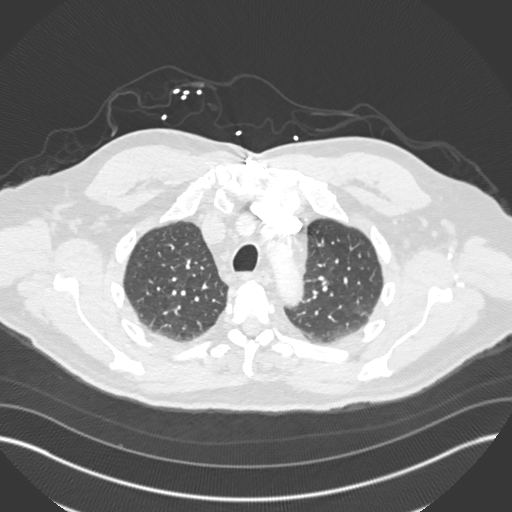
[im 193/250  soft-tissue]
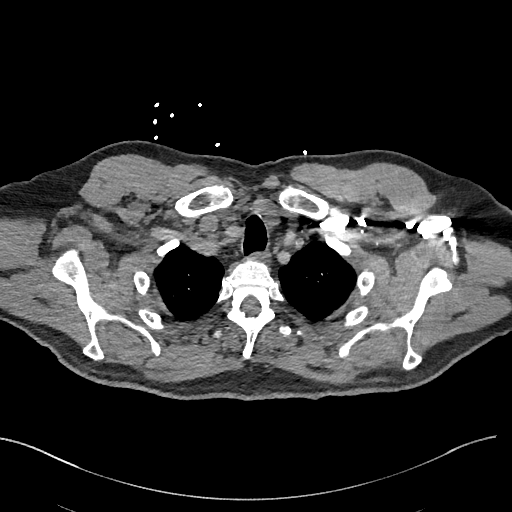
[im 204/250  lung]
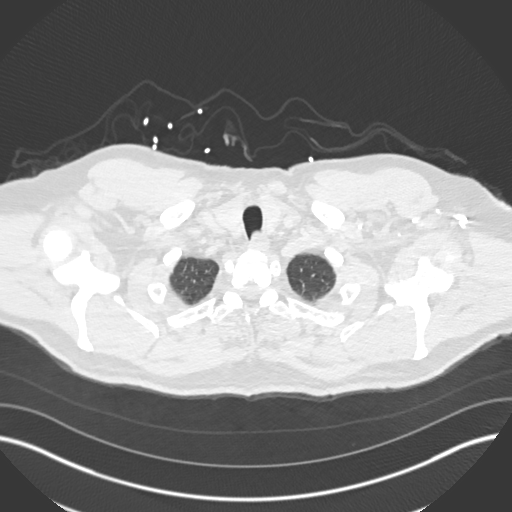
[im 216/250  soft-tissue]
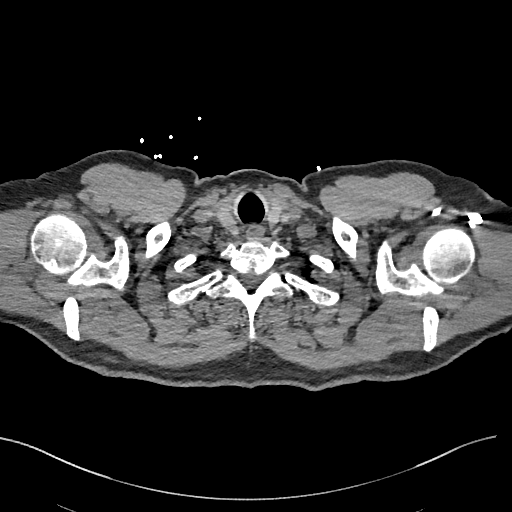
[im 238/250  lung]
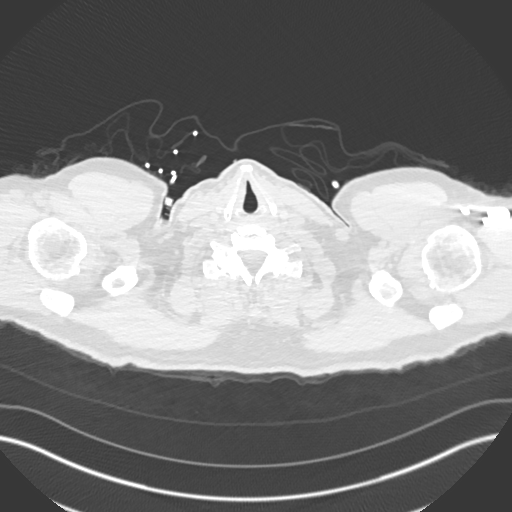

[Series 7: coronal mpr · coronal · 0.49mm/px · 3 of 128 slices shown]
[im 32/128  soft-tissue]
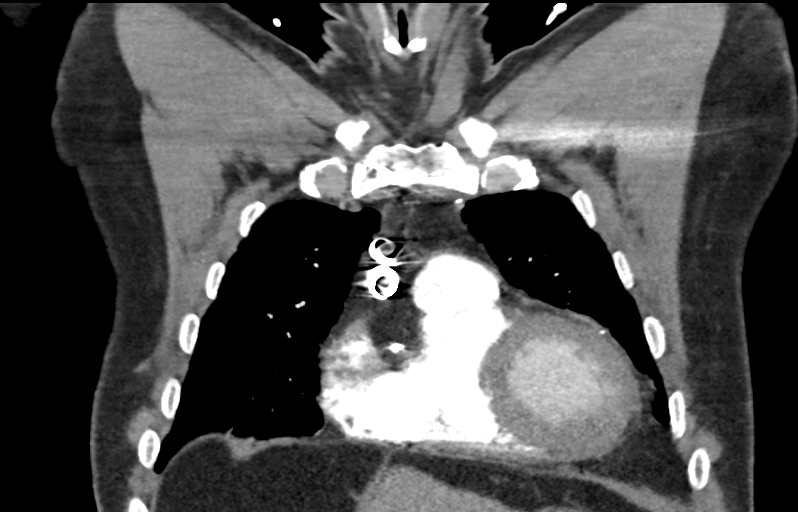
[im 64/128  soft-tissue]
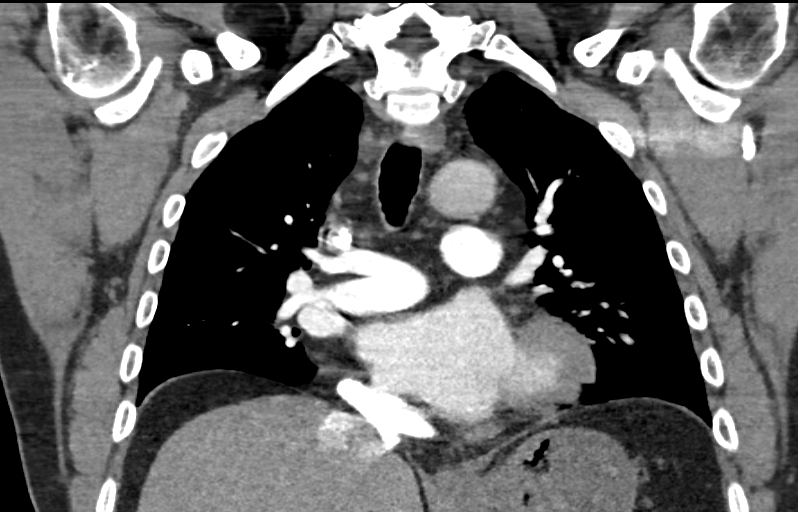
[im 96/128  soft-tissue]
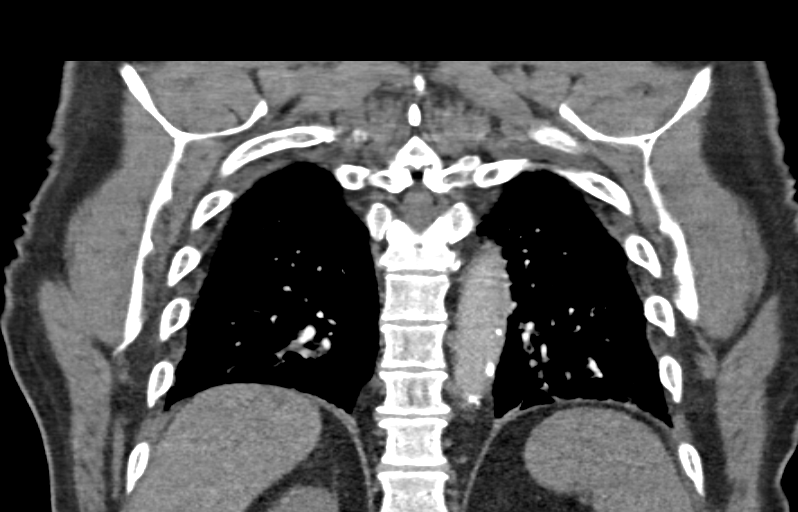

[18 of 46 positions shown; findings below may reference images not displayed]

FINDINGS: No evidence of pulmonary embolism.

Lungs are essentially clear. Mild deep and atelectasis in the
bilateral lower lobes. No suspicious pulmonary nodules. No pleural
effusion or pneumothorax.

Visualized thyroid is unremarkable.

Cardiomegaly. No pericardial effusion. Coronary atherosclerosis.
Postsurgical changes related to prior CABG. Atherosclerotic
calcifications of the aortic arch.

No suspicious mediastinal, hilar, or axillary lymphadenopathy.

No visualized upper abdomen is unremarkable.

Mild degenerative changes of the visualized thoracolumbar spine.

Review of the MIP images confirms the above findings.
IMPRESSION: No evidence of pulmonary embolism.

No evidence of acute cardiopulmonary disease.

## 2018-10-08 ENCOUNTER — Ambulatory Visit (INDEPENDENT_AMBULATORY_CARE_PROVIDER_SITE_OTHER): Payer: No Typology Code available for payment source

## 2018-10-08 ENCOUNTER — Ambulatory Visit (HOSPITAL_COMMUNITY): Payer: Self-pay

## 2018-10-08 ENCOUNTER — Encounter (HOSPITAL_COMMUNITY): Payer: Self-pay

## 2018-10-08 ENCOUNTER — Ambulatory Visit (HOSPITAL_COMMUNITY)
Admission: EM | Admit: 2018-10-08 | Discharge: 2018-10-08 | Disposition: A | Discharge status: Home / Self Care | Payer: No Typology Code available for payment source | Attending: Family Medicine | Admitting: Family Medicine

## 2018-10-08 ENCOUNTER — Other Ambulatory Visit: Payer: Self-pay

## 2018-10-08 DIAGNOSIS — S93401A Sprain of unspecified ligament of right ankle, initial encounter: Secondary | ICD-10-CM

## 2018-10-08 DIAGNOSIS — M79671 Pain in right foot: Secondary | ICD-10-CM | POA: Diagnosis not present

## 2018-10-08 NOTE — ED Triage Notes (Signed)
Patient presents to Urgent Care with complaints of right ankle pain since possibly spraining it 2 weeks ago. Patient reports he does not remember specifically what he did to injure his ankle, but was playing golf the day that it began hurting. Pt has ankle brace on for additional support.

## 2018-10-08 NOTE — Discharge Instructions (Signed)
Practice rest, ice, compression, elevation. Follow up with ortho for further evaluation/management

## 2018-10-08 NOTE — ED Provider Notes (Signed)
[facility]    CSN: [id number] Arrival date & time: 10/08/18  1048     History   Chief Complaint Chief Complaint  Patient presents with  . Ankle Injury    HPI ZYSHAWN BOHNENKAMP is a 73 y.o. male with history of hypertension presenting for acute concern of right ankle pain and swelling.  Patient states that he rolled his ankle (inversions) 2 weeks ago from ground-level.  Patient bought OTC compression sleeve which is helped with some of the pain/swelling.  Patient states that he has been doing a lot of going up and down stairs into the basement, carrying household objects which has exacerbated his symptoms.  Patient using ice, OTC NSAIDs, elevation with moderate relief.  He denies re-traumatization of the area, inability to bear weight, bruising paresthesias, numbness.    Past Medical History:  Diagnosis Date  . Coronary artery disease   . High cholesterol   . Hx of CABG   . Hypertension     Patient Active Problem List   Diagnosis Date Noted  . CAD (coronary artery disease) 07/19/2013  . HTN (hypertension) 07/19/2013  . Bradycardia 07/19/2013  . Hyperlipidemia 07/19/2013  . Near syncope 07/18/2013    Past Surgical History:  Procedure Laterality Date  . CARDIAC SURGERY    . CORONARY ARTERY BYPASS GRAFT    . MODIFIED RADICAL MASTECTOMY    . MOHS SURGERY         Home Medications    Prior to Admission medications   Medication Sig Start Date End Date Taking? Authorizing Provider  aspirin EC 81 MG tablet Take 81 mg by mouth daily.     [provider]  cetirizine (ZYRTEC) 10 MG tablet Take 10 mg by mouth daily.    [provider]  Cholecalciferol (VITAMIN D) 2000 UNITS tablet Take 2,000 Units by mouth daily.    [provider]  doxycycline (VIBRAMYCIN) 100 MG capsule Take 100 mg by mouth 2 (two) times daily.    [provider]  DOXYCYCLINE PO Take 1 tablet by mouth 2 (two) times daily.    [provider]  Multiple  Vitamins-Minerals (MULTIVITAMIN WITH MINERALS) tablet Take 1 tablet by mouth daily.    [provider]  Omega-3 Fatty Acids (FISH OIL PO) Take 1 tablet by mouth daily.    [provider]  simvastatin (ZOCOR) 10 MG tablet Take 10 mg by mouth at bedtime.    [provider]  valsartan (DIOVAN) 160 MG tablet Take 160 mg by mouth daily.    [provider]  vitamin E 400 UNIT capsule Take 400 Units by mouth daily.    [provider]    Family History Family History  Problem Relation Age of Onset  . CAD Father   . Bladder Cancer Father     Social History Social History   Tobacco Use  . Smoking status: Never Smoker  . Smokeless tobacco: Never Used  Substance Use Topics  . Alcohol use: Yes    Comment: One drink beer thrice a week.  . Drug use: Never     Allergies   Ramipril   Review of Systems As per HPI   Physical Exam Triage Vital Signs ED Triage Vitals  Enc Vitals Group     BP 10/08/18 1109 (!) 151/89     Pulse Rate 10/08/18 1109 95     Resp 10/08/18 1109 17     Temp 10/08/18 1109 98.4 F (36.9 C)  Temp Source 10/08/18 1109 Oral     SpO2 10/08/18 1109 100 %     Weight --      Height --      Head Circumference --      Peak Flow --      Pain Score 10/08/18 1105 5     Pain Loc --      Pain Edu? --      Excl. in [place]? --    No data found.  Updated Vital Signs BP (!) 151/89 (BP Location: Right Arm)   Pulse 95   Temp 98.4 F (36.9 C) (Oral)   Resp 17   SpO2 100%   Visual Acuity Right Eye Distance:   Left Eye Distance:   Bilateral Distance:    Right Eye Near:   Left Eye Near:    Bilateral Near:     Physical Exam Constitutional:      General: He is not in acute distress. HENT:     Head: Normocephalic and atraumatic.  Eyes:     General: No scleral icterus.    Pupils: Pupils are equal, round, and reactive to light.  Cardiovascular:     Rate and Rhythm: Normal rate.  Pulmonary:     Effort: Pulmonary  effort is normal.  Musculoskeletal:     Comments: Moderate swelling of right ankle that is TTP (lateral malleolus > medial) without ecchymosis, erythema, open wound.  Patient has full active range of motion, though endorses pain with plantarflexion, particularly against resistance.  Patient able to ambulate and evenly distribute weight through foot, though gait slightly antalgic favoring right.  DP pulses 2+ bilaterally  Skin:    Coloration: Skin is not jaundiced or pale.  Neurological:     Mental Status: He is alert and oriented to person, place, and time.      UC Treatments / Results  Labs (all labs ordered are listed, but only abnormal results are displayed) Labs Reviewed - No data to display  EKG None  Radiology Dg Foot Complete Right  Result Date: 10/08/2018 CLINICAL DATA:  Right foot pain EXAM: RIGHT FOOT COMPLETE - 3+ VIEW COMPARISON:  None. FINDINGS: There is no evidence of fracture or dislocation. There is no evidence of arthropathy or other focal bone abnormality. Soft tissues are unremarkable. IMPRESSION: No acute osseous injury of the right foot. Electronically Signed   By: Kathreen Devoid   On: 10/08/2018 12:44    Procedures Procedures (including critical care time)  Medications Ordered in UC Medications - No data to display  Initial Impression / Assessment and Plan / UC Course  I have reviewed the triage vital signs and the nursing notes.  Pertinent labs & imaging results that were available during my care of the patient were reviewed by me and considered in my medical decision making (see chart for details).    73 year old male with a 2-week course of right ankle pain and swelling status post inversion injury at ground level.  X-ray done in office, reviewed by radiology negative for acute osseous injury of right foot.  Patient given ASO brace in office for additional compression, support, and instructed to follow-up with Ortho.  Cautions discussed, patient verbalized  understanding. Final Clinical Impressions(s) / UC Diagnoses   Final diagnoses:  Sprain of right ankle, unspecified ligament, initial encounter     Discharge Instructions     Practice rest, ice, compression, elevation. Follow up with ortho for further evaluation/management    ED Prescriptions    None  Controlled Substance Prescriptions [place] Controlled Substance Registry consulted? Not Applicable   Quincy Sheehan, [place] 10/08/18 1436

## 2019-03-19 ENCOUNTER — Other Ambulatory Visit: Payer: Self-pay

## 2019-03-19 DIAGNOSIS — Z20822 Contact with and (suspected) exposure to covid-19: Secondary | ICD-10-CM

## 2019-03-21 LAB — NOVEL CORONAVIRUS, NAA: SARS-CoV-2, NAA: NOT DETECTED

## 2021-05-31 ENCOUNTER — Other Ambulatory Visit: Payer: Self-pay | Admitting: Family Medicine

## 2021-10-04 DIAGNOSIS — M25552 Pain in left hip: Secondary | ICD-10-CM | POA: Insufficient documentation

## 2021-11-21 DIAGNOSIS — M1612 Unilateral primary osteoarthritis, left hip: Secondary | ICD-10-CM | POA: Diagnosis present

## 2021-11-30 DIAGNOSIS — I493 Ventricular premature depolarization: Secondary | ICD-10-CM | POA: Insufficient documentation

## 2021-11-30 DIAGNOSIS — L281 Prurigo nodularis: Secondary | ICD-10-CM | POA: Insufficient documentation

## 2021-11-30 DIAGNOSIS — L853 Xerosis cutis: Secondary | ICD-10-CM | POA: Insufficient documentation

## 2021-11-30 DIAGNOSIS — I340 Nonrheumatic mitral (valve) insufficiency: Secondary | ICD-10-CM | POA: Insufficient documentation

## 2021-11-30 DIAGNOSIS — I429 Cardiomyopathy, unspecified: Secondary | ICD-10-CM | POA: Insufficient documentation

## 2021-11-30 DIAGNOSIS — G4733 Obstructive sleep apnea (adult) (pediatric): Secondary | ICD-10-CM

## 2021-11-30 DIAGNOSIS — F4310 Post-traumatic stress disorder, unspecified: Secondary | ICD-10-CM | POA: Insufficient documentation

## 2021-11-30 DIAGNOSIS — C4441 Basal cell carcinoma of skin of scalp and neck: Secondary | ICD-10-CM | POA: Insufficient documentation

## 2021-11-30 DIAGNOSIS — I441 Atrioventricular block, second degree: Secondary | ICD-10-CM | POA: Insufficient documentation

## 2021-11-30 DIAGNOSIS — F419 Anxiety disorder, unspecified: Secondary | ICD-10-CM | POA: Insufficient documentation

## 2021-11-30 DIAGNOSIS — I5032 Chronic diastolic (congestive) heart failure: Secondary | ICD-10-CM | POA: Insufficient documentation

## 2021-12-20 DIAGNOSIS — Z01818 Encounter for other preprocedural examination: Secondary | ICD-10-CM | POA: Insufficient documentation

## 2021-12-30 DIAGNOSIS — I442 Atrioventricular block, complete: Secondary | ICD-10-CM | POA: Insufficient documentation

## 2022-02-15 NOTE — H&P (Cosign Needed Addendum)
TOTAL HIP ADMISSION H&P  Patient is admitted for left total hip arthroplasty.  Subjective:  Chief Complaint: Left hip pain  HPI: Richard Camacho, 76 y.o. male, has a history of pain and functional disability in the left hip due to arthritis and patient has failed non-surgical conservative treatments for greater than 12 weeks to include NSAID's and/or analgesics, use of assistive devices, and activity modification. Onset of symptoms was gradual, starting  several  years ago with gradually worsening course since that time. The patient noted no past surgery on the left hip. Patient currently rates pain in the left hip at 8 out of 10 with activity. Patient has night pain, worsening of pain with activity and weight bearing, pain that interfers with activities of daily living, and pain with passive range of motion. Patient has evidence of  severe bone-on-bone arthritis in the left hip with some collapse of the femoral head and bony erosion of the femoral head and superior acetabulum  by imaging studies. This condition presents safety issues increasing the risk of falls. There is no current active infection.  Patient Active Problem List   Diagnosis Date Noted   CAD (coronary artery disease) 07/19/2013   HTN (hypertension) 07/19/2013   Bradycardia 07/19/2013   Hyperlipidemia 07/19/2013   Near syncope 07/18/2013    Past Medical History:  Diagnosis Date   Coronary artery disease    High cholesterol    Hx of CABG    Hypertension     Past Surgical History:  Procedure Laterality Date   CARDIAC SURGERY     CORONARY ARTERY BYPASS GRAFT     MODIFIED RADICAL MASTECTOMY     MOHS SURGERY      Prior to Admission medications   Medication Sig Start Date End Date Taking? Authorizing Provider  aspirin EC 81 MG tablet Take 81 mg by mouth daily.     [provider]  cetirizine (ZYRTEC) 10 MG tablet Take 10 mg by mouth daily.    [provider]  Cholecalciferol (VITAMIN D) 2000 UNITS  tablet Take 2,000 Units by mouth daily.    [provider]  doxycycline (VIBRAMYCIN) 100 MG capsule Take 100 mg by mouth 2 (two) times daily.    [provider]  DOXYCYCLINE PO Take 1 tablet by mouth 2 (two) times daily.    [provider]  Multiple Vitamins-Minerals (MULTIVITAMIN WITH MINERALS) tablet Take 1 tablet by mouth daily.    [provider]  Omega-3 Fatty Acids (FISH OIL PO) Take 1 tablet by mouth daily.    [provider]  simvastatin (ZOCOR) 10 MG tablet Take 10 mg by mouth at bedtime.    [provider]  valsartan (DIOVAN) 160 MG tablet Take 160 mg by mouth daily.    [provider]  vitamin E 400 UNIT capsule Take 400 Units by mouth daily.    [provider]    Allergies  Allergen Reactions   Ramipril Other (See Comments) and Cough    "Cough and other"    Social History   Socioeconomic History   Marital status: Married    Spouse name: Not on file   Number of children: Not on file   Years of education: Not on file   Highest education level: Not on file  Occupational History   Not on file  Tobacco Use   Smoking status: Never   Smokeless tobacco: Never  Vaping Use   Vaping Use: Never used  Substance and Sexual Activity  Alcohol use: Yes    Comment: One drink beer thrice a week.   Drug use: Never   Sexual activity: Not on file  Other Topics Concern   Not on file  Social History Narrative   Not on file   Social Determinants of Health   Financial Resource Strain: Not on file  Food Insecurity: Not on file  Transportation Needs: Not on file  Physical Activity: Not on file  Stress: Not on file  Social Connections: Not on file  Intimate Partner Violence: Not on file    Tobacco Use: Low Risk  (10/08/2018)   Patient History    Smoking Tobacco Use: Never    Smokeless Tobacco Use: Never    Passive Exposure: Not on file   Social History   Substance and Sexual Activity  Alcohol Use Yes    Comment: One drink beer thrice a week.    Family History  Problem Relation Age of Onset   CAD Father    Bladder Cancer Father     Review of Systems  Constitutional:  Negative for chills and fever.  HENT:  Negative for congestion, sore throat and tinnitus.   Eyes:  Negative for double vision, photophobia and pain.  Respiratory:  Negative for cough, shortness of breath and wheezing.   Cardiovascular:  Negative for chest pain, palpitations and orthopnea.  Gastrointestinal:  Negative for heartburn, nausea and vomiting.  Genitourinary:  Negative for dysuria, frequency and urgency.  Musculoskeletal:  Positive for joint pain.  Neurological:  Negative for dizziness, weakness and headaches.     Objective:  Physical Exam: Well nourished and well developed.  General: Alert and oriented x3, cooperative and pleasant, no acute distress.  Head: normocephalic, atraumatic, neck supple.  Eyes: EOMI.  Musculoskeletal:  Left Hip Exam:  The range of motion: Flexion to 90 degrees, Internal Rotation is minimal, External Rotation to 10 to 20 degrees, and abduction to 20 degrees without discomfort.  There is no tenderness over the greater trochanteric bursa.   Calves soft and nontender. Motor function intact in LE. Strength 5/5 LE bilaterally. Neuro: Distal pulses 2+. Sensation to light touch intact in LE.   Imaging Review Plain radiographs demonstrate severe degenerative joint disease of the left hip. The bone quality appears to be adequate for age and reported activity level.  Assessment/Plan:  End stage arthritis, left hip  The patient history, physical examination, clinical judgement of the provider and imaging studies are consistent with end stage degenerative joint disease of the left hip and total hip arthroplasty is deemed medically necessary. The treatment options including medical management, injection therapy, arthroscopy and arthroplasty were discussed at length. The risks and  benefits of total hip arthroplasty were presented and reviewed. The risks due to aseptic loosening, infection, stiffness, dislocation/subluxation, thromboembolic complications and other imponderables were discussed. The patient acknowledged the explanation, agreed to proceed with the plan and consent was signed. Patient is being admitted for inpatient treatment for surgery, pain control, PT, OT, prophylactic antibiotics, VTE prophylaxis, progressive ambulation and ADLs and discharge planning.The patient is planning to be discharged  home .  Therapy Plans: HEP Disposition: Home with friend Planned DVT Prophylaxis: Xarelto 10 mg QD DME Needed: Gilford Rile PCP: Nicola Police, MD (clearance received) Cardiologist: Sandrea Matte, MD (clearance received) Cardiologist: Magda Paganini, MD (pacemaker placement) TXA: IV Allergies: Latex (rash), ramipril Anesthesia Concerns: None BMI: 29.2 Last HgbA1c: Not diabetic.  Pharmacy: [facility])  Other: - Pacemaker placed in September. Sees cardiologist with [facility] as well as  Dr. Delsa Sale.  - Hx CABG x3  - Patient was instructed on what medications to stop prior to surgery. - Follow-up visit in 2 weeks with Dr. Wynelle Link - Begin physical therapy following surgery - Pre-operative lab work as pre-surgical testing - Prescriptions will be provided in hospital at time of discharge  Theresa Duty, PA-C Orthopedic Surgery [facility] [place]

## 2022-02-23 NOTE — Patient Instructions (Addendum)
SURGICAL WAITING ROOM VISITATION Patients having surgery or a procedure may have no more than 2 support people in the waiting area - these visitors may rotate in the visitor waiting room.   Children under the age of 30 must have an adult with them who is not the patient. If the patient needs to stay at the hospital during part of their recovery, the visitor guidelines for inpatient rooms apply.  PRE-OP VISITATION  Pre-op nurse will coordinate an appropriate time for 1 support person to accompany the patient in pre-op.  This support person may not rotate.  This visitor will be contacted when the time is appropriate for the visitor to come back in the pre-op area.  Please refer to the [facility] website for the visitor guidelines for Inpatients (after your surgery is over and you are in a regular room).  You are not required to quarantine at this time prior to your surgery. However, you must do this: Hand Hygiene often Do NOT share personal items Notify your provider if you are in close contact with someone who has COVID or you develop fever 100.4 or greater, new onset of sneezing, cough, sore throat, shortness of breath or body aches.  If you test positive for Covid or have been in contact with anyone that has tested positive in the last 10 days please notify you surgeon.    Your procedure is scheduled on:  Wednesday   March 09, 2022  Report to [facility] Main Entrance: [address] entrance where the [employer] is available.   Report to admitting at: 11:00    AM  +++++Call this number if you have any questions or problems the morning of surgery [phone]  Do not eat food after Midnight the night prior to your surgery/procedure.  After Midnight you may have the following liquids until  10:30 AM DAY OF SURGERY  Clear Liquid Diet Water Black Coffee (sugar ok, NO MILK/CREAM OR CREAMERS)  Tea (sugar ok, NO MILK/CREAM OR CREAMERS) regular and decaf                              Plain Jell-O  with no fruit (NO RED)                                           Fruit ices (not with fruit pulp, NO RED)                                     Popsicles (NO RED)                                                                  Juice: apple, WHITE grape, WHITE cranberry Sports drinks like Gatorade or Powerade (NO RED)                   The day of surgery:  Drink ONE (1) Pre-Surgery Clear Ensure at  10:30 AM the morning of surgery. Drink in one sitting. Do not sip.  This drink was given  to you during your hospital pre-op appointment visit. Nothing else to drink after completing the Pre-Surgery Clear Ensure : No candy, chewing gum or throat lozenges.    FOLLOW ANY ADDITIONAL PRE OP INSTRUCTIONS YOU RECEIVED FROM YOUR SURGEON'S OFFICE!!!   Oral Hygiene is also important to reduce your risk of infection.        Remember - BRUSH YOUR TEETH THE MORNING OF SURGERY WITH YOUR REGULAR TOOTHPASTE   Take ONLY these medicines the morning of surgery with A SIP OF WATER:  Tamsulosin, tramadol  If You have been diagnosed with Sleep Apnea - Bring CPAP mask and tubing day of surgery. We will provide you with a CPAP machine on the day of your surgery.                   You may not have any metal on your body including  jewelry, and body piercing  Do not wear lotions, powders,  cologne, or deodorant  Men may shave face and neck.  You may bring a small overnight bag with you on the day of surgery, only pack items that are not valuable .[facility] IS NOT RESPONSIBLE   FOR VALUABLES THAT ARE LOST OR STOLEN.   Do not bring your home medications to the hospital. The Pharmacy will dispense medications listed on your medication list to you during your admission in the Hospital.  Special Instructions: Bring a copy of your healthcare power of attorney and living will documents the day of surgery, if you wish to have them scanned into your [facility] Records- EPIC  Please read over the  following fact sheets you were given: IF YOU HAVE QUESTIONS ABOUT YOUR PRE-OP INSTRUCTIONS, PLEASE CALL [phone]  ([place])   [place] - Preparing for Surgery Before surgery, you can play an important role.  Because skin is not sterile, your skin needs to be as free of germs as possible.  You can reduce the number of germs on your skin by washing with CHG (chlorahexidine gluconate) soap before surgery.  CHG is an antiseptic cleaner which kills germs and bonds with the skin to continue killing germs even after washing. Please DO NOT use if you have an allergy to CHG or antibacterial soaps.  If your skin becomes reddened/irritated stop using the CHG and inform your nurse when you arrive at Short Stay. Do not shave (including legs and underarms) for at least 48 hours prior to the first CHG shower.  You may shave your face/neck.  Please follow these instructions carefully:  1.  Shower with CHG Soap the night before surgery and the  morning of surgery.  2.  If you choose to wash your hair, wash your hair first as usual with your normal  shampoo.  3.  After you shampoo, rinse your hair and body thoroughly to remove the shampoo.                             4.  Use CHG as you would any other liquid soap.  You can apply chg directly to the skin and wash.  Gently with a scrungie or clean washcloth.  5.  Apply the CHG Soap to your body ONLY FROM THE NECK DOWN.   Do not use on face/ open                           Wound or open sores.  Avoid contact with eyes, ears mouth and genitals (private parts).                       Wash face,  Genitals (private parts) with your normal soap.             6.  Wash thoroughly, paying special attention to the area where your  surgery  will be performed.  7.  Thoroughly rinse your body with warm water from the neck down.  8.  DO NOT shower/wash with your normal soap after using and rinsing off the CHG Soap.            9.  Pat yourself dry with a clean towel.            10.   Wear clean pajamas.            11.  Place clean sheets on your bed the night of your first shower and do not  sleep with pets.  ON THE DAY OF SURGERY : Do not apply any lotions/deodorants the morning of surgery.  Please wear clean clothes to the hospital/surgery center.    FAILURE TO FOLLOW THESE INSTRUCTIONS MAY RESULT IN THE CANCELLATION OF YOUR SURGERY  PATIENT SIGNATURE_________________________________  NURSE SIGNATURE__________________________________  ________________________________________________________________________          Adam Phenix    An incentive spirometer is a tool that can help keep your lungs clear and active. This tool measures how well you are filling your lungs with each breath. Taking long deep breaths may help reverse or decrease the chance of developing breathing (pulmonary) problems (especially infection) following: A long period of time when you are unable to move or be active. BEFORE THE PROCEDURE  If the spirometer includes an indicator to show your best effort, your nurse or respiratory therapist will set it to a desired goal. If possible, sit up straight or lean slightly forward. Try not to slouch. Hold the incentive spirometer in an upright position. INSTRUCTIONS FOR USE  Sit on the edge of your bed if possible, or sit up as far as you can in bed or on a chair. Hold the incentive spirometer in an upright position. Breathe out normally. Place the mouthpiece in your mouth and seal your lips tightly around it. Breathe in slowly and as deeply as possible, raising the piston or the ball toward the top of the column. Hold your breath for 3-5 seconds or for as long as possible. Allow the piston or ball to fall to the bottom of the column. Remove the mouthpiece from your mouth and breathe out normally. Rest for a few seconds and repeat Steps 1 through 7 at least 10 times every 1-2 hours when you are awake. Take your time and take a few  normal breaths between deep breaths. The spirometer may include an indicator to show your best effort. Use the indicator as a goal to work toward during each repetition. After each set of 10 deep breaths, practice coughing to be sure your lungs are clear. If you have an incision (the cut made at the time of surgery), support your incision when coughing by placing a pillow or rolled up towels firmly against it. Once you are able to get out of bed, walk around indoors and cough well. You may stop using the incentive spirometer when instructed by your caregiver.  RISKS AND COMPLICATIONS Take your time so you do not get dizzy or light-headed. If you are in  pain, you may need to take or ask for pain medication before doing incentive spirometry. It is harder to take a deep breath if you are having pain. AFTER USE Rest and breathe slowly and easily. It can be helpful to keep track of a log of your progress. Your caregiver can provide you with a simple table to help with this. If you are using the spirometer at home, follow these instructions: [place] IF:  You are having difficultly using the spirometer. You have trouble using the spirometer as often as instructed. Your pain medication is not giving enough relief while using the spirometer. You develop fever of 100.5 F (38.1 C) or higher.                                                                                                    SEEK IMMEDIATE MEDICAL CARE IF:  You cough up bloody sputum that had not been present before. You develop fever of 102 F (38.9 C) or greater. You develop worsening pain at or near the incision site. MAKE SURE YOU:  Understand these instructions. Will watch your condition. Will get help right away if you are not doing well or get worse. Document Released: 08/22/2006 Document Revised: 07/04/2011 Document Reviewed: 10/23/2006 [facility] Patient Information 2014 [place], [place].

## 2022-02-23 NOTE — Progress Notes (Addendum)
COVID Vaccine received:  _0  No _1  Yes Date of any COVID positive Test in last 2 days:none  PCP -  Dr. Benay Pike  at [facility] clearance in chart  Cardiologist - Sandrea Matte, MD  ([place])  EP-  Magda Paganini MD  (PPM placement) at [facility]  [facility] EP Remote Device Monitoring [address]   [place], [place] [address]   [phone]    Chest x-ray - 12-31-2021  CEW  [facility] EKG -  do at [facility] Stress Test -  ECHO - 2015  Epic Cardiac Cath - done at [facility]  12-30-2021 [facility]W EP ablation done 12-31-2021 at [facility]   PCR screen: _2  Ordered & Completed                      _3   No Order but Needs PROFEND                      _4   N/A for this surgery  Surgery Plan:  _5  Ambulatory  _6  Outpatient in bed  _7  Admit Anesthesia:    _8  General  _9  Spinal  _10   Choice _11   MAC  Pacemaker Biventicular device: Yes  Notes in chart for Magnet instructions and intraoperative care. Device Form faxed to [place] EP office at [place] [facility] 12/30/2021 with left bundle area lead and quad lead in the CS - last interrogation was 01-06-2022 by Lynann Bologna, MPAS, PA-C at [place] office  History of Sleep Apnea? _12  No _13  Yes   CPAP used?- _14  No _15  Yes  (Instruct to bring their mask & Tubing)  6-8 pressure   [place] CPAP w/ humidifer form sent to [facility]  Does the patient monitor blood sugar? _16  No _17  Yes  _18  N/A  Blood Thinner / Instructions:  none Aspirin Instructions: none  ERAS Protocol Ordered: _19  No  _20  Yes PRE-SURGERY _21  ENSURE  _22  G2  _23  No Drink Ordered  Comments: LATEX allergy- low just rash only.   Activity level: Patient can not climb a flight of stairs without difficulty; _24  No CP would have _SOB and Leg pain.  Patient can perform ADLs without assistance.   Anesthesia review: CABG  2012, CAD, Bradycardia-caused by Metoprolol use only, recent EP ablation for Vent. Tachy., HTN  Patient denies  shortness of breath, fever, cough and chest pain at PAT appointment.  Patient verbalized understanding and agreement to the Pre-Surgical Instructions that were given to them at this PAT appointment. Patient was also educated of the need to review these PAT instructions again prior to his/her surgery.I reviewed the appropriate phone numbers to call if they have any and questions or concerns.

## 2022-02-28 ENCOUNTER — Encounter (HOSPITAL_COMMUNITY)
Admission: RE | Admit: 2022-02-28 | Discharge: 2022-02-28 | Disposition: A | Discharge status: Home / Self Care | Payer: No Typology Code available for payment source | Source: Ambulatory Visit | Attending: Orthopedic Surgery | Admitting: Orthopedic Surgery

## 2022-02-28 ENCOUNTER — Encounter (HOSPITAL_COMMUNITY): Payer: Self-pay

## 2022-02-28 ENCOUNTER — Other Ambulatory Visit: Payer: Self-pay

## 2022-02-28 VITALS — BP 144/85 | HR 70 | Temp 98.5°F | Resp 14 | Ht 71.0 in | Wt 210.0 lb

## 2022-02-28 DIAGNOSIS — I10 Essential (primary) hypertension: Secondary | ICD-10-CM | POA: Insufficient documentation

## 2022-02-28 DIAGNOSIS — Z01818 Encounter for other preprocedural examination: Secondary | ICD-10-CM | POA: Insufficient documentation

## 2022-02-28 HISTORY — DX: Malignant (primary) neoplasm, unspecified: C80.1

## 2022-02-28 HISTORY — DX: Unspecified osteoarthritis, unspecified site: M19.90

## 2022-02-28 LAB — CBC
HCT: 40.5 % (ref 39.0–52.0)
Hemoglobin: 13.3 g/dL (ref 13.0–17.0)
MCH: 33.3 pg (ref 26.0–34.0)
MCHC: 32.8 g/dL (ref 30.0–36.0)
MCV: 101.3 fL — ABNORMAL HIGH (ref 80.0–100.0)
Platelets: 215 10*3/uL (ref 150–400)
RBC: 4 MIL/uL — ABNORMAL LOW (ref 4.22–5.81)
RDW: 12.1 % (ref 11.5–15.5)
WBC: 6.1 10*3/uL (ref 4.0–10.5)
nRBC: 0 % (ref 0.0–0.2)

## 2022-02-28 LAB — BASIC METABOLIC PANEL
Anion gap: 9 (ref 5–15)
BUN: 17 mg/dL (ref 8–23)
CO2: 25 mmol/L (ref 22–32)
Calcium: 9.9 mg/dL (ref 8.9–10.3)
Chloride: 105 mmol/L (ref 98–111)
Creatinine, Ser: 0.69 mg/dL (ref 0.61–1.24)
GFR, Estimated: >60 mL/min (ref >60)
Glucose, Bld: 108 mg/dL — ABNORMAL HIGH (ref 70–99)
Potassium: 4.4 mmol/L (ref 3.5–5.1)
Sodium: 139 mmol/L (ref 135–145)

## 2022-02-28 LAB — SURGICAL PCR SCREEN
MRSA, PCR: NEGATIVE
Staphylococcus aureus: POSITIVE — AB

## 2022-02-28 NOTE — Progress Notes (Signed)
Patient's PCR screen is positive for STAPH. Appropriate notes have been placed on the patient's chart. This note has been routed to [place]         for review. The Patient's surgery is currently scheduled for:  Left THA on 03-09-22 at [facility].  Richard Camacho, BSN, CVRN-BC   Pre-Surgical Testing Nurse [place]  [phone]

## 2022-03-02 NOTE — Progress Notes (Signed)
Anesthesia Chart Review   Case: [id number] Date/Time: 03/09/22 1215   Procedure: TOTAL HIP ARTHROPLASTY ANTERIOR APPROACH (Left: Hip)   Anesthesia type: Choice   Pre-op diagnosis: left hip osteoarthritis   Location: [facility] ROOM 10 / [facility]   Surgeons: Gaynelle Arabian, MD       DISCUSSION:76 y.o. never smoker with h/o HTN, CAD (CABG 2012), pacemaker in place with device orders on chart, left hip OA scheduled for above procedure 03/09/2022 with Dr. Gaynelle Arabian.   Pt cleared by cardiology 02/14/2022.   12/2021 Unsuccessful redo para-Hisian PVC ablation with use of cryoablation at  [facility] via [facility] complicated by LBBB, CHB necessitating biv pacer implantation   12/2020 TTE (DVAMC) EF 50%, mod LVH, diastolic fx indeterminate, mod LAE, mild  RAE, mild RVE, normal R fx, mod MR, triv TR, RVSP 22 mmHg   Cardiac Cath 11/2021 PROCEDURE SUMMARY 3 vessel and branch vessel CAD with patent LIMA-LAD, SVG-OM2 and SVG-D1, extensive collaterals to isolated distal RCA/PDA/RPLB, superior branch D1, OM3; all essentially unchanged from 2022 diagnostic cath RECOMMENDATIONS 1. Trial of long acting nitrates for small vessel disease and collaterals 2. Proceed with PVC ablation as planned. If PPM is placed consider initiation of beta-blocker for CAD after.  3. Recommended patient re-initiate ASA/statin for secondary prevention  VS: BP (!) 144/85 Comment: Right arm sitting  Pulse 70   Temp 36.9 C (Oral)   Resp 14   Ht '5\' 11"'$  (1.803 m)   Wt 95.3 kg   SpO2 96%   BMI 29.29 kg/m   PROVIDERS: [facility] [place]  Cardiologist - Sandrea Matte, MD  ([place])  LABS: Labs reviewed: Acceptable for surgery. (all labs ordered are listed, but only abnormal results are displayed)  Labs Reviewed  SURGICAL PCR SCREEN - Abnormal; Notable for the following components:      Result Value   Staphylococcus aureus POSITIVE (*)    All other components within normal limits  BASIC METABOLIC PANEL - Abnormal; Notable for the  following components:   Glucose, Bld 108 (*)    All other components within normal limits  CBC - Abnormal; Notable for the following components:   RBC 4.00 (*)    MCV 101.3 (*)    All other components within normal limits  TYPE AND SCREEN     IMAGES:   EKG:   CV: Echo 07/19/2013 Study Conclusions   - Left ventricle: The cavity size was normal. Wall thickness    was normal. The estimated ejection fraction was 50%. Wall    motion was normal; there were no regional wall motion    abnormalities.  - Aortic valve: Sclerosis without stenosis. No significant    regurgitation.  - Mitral valve: Mild regurgitation.  - Left atrium: The atrium was moderately to severely    dilated.  - Right ventricle: The cavity size was mildly to moderately    dilated. Systolic function was mildly to moderately    reduced.  - Right atrium: The atrium was moderately dilated  Past Medical History:  Diagnosis Date   Arthritis    Cancer ([place])    numerous skin cancers, Mohs surgery.   Coronary artery disease    High cholesterol    Hx of CABG    Hypertension     Past Surgical History:  Procedure Laterality Date   CORONARY ARTERY BYPASS GRAFT  2012   done at [facility] Left    infected breast tissue   MOHS SURGERY  MEDICATIONS:  zinc gluconate 50 MG tablet   APPLE CIDER VINEGAR PO   HOMEOPATHIC PRODUCTS PO   MAGNESIUM PO   meloxicam (MOBIC) 15 MG tablet   NATTOKINASE PO   niacinamide 500 MG tablet   OVER THE COUNTER MEDICATION   Saccharomyces boulardii 250 MG PACK   tamsulosin (FLOMAX) 0.4 MG CAPS capsule   traMADol (ULTRAM) 50 MG tablet   valsartan (DIOVAN) 80 MG tablet   Vitamin D-Vitamin K (K2 PLUS D3 PO)   No current facility-administered medications for this encounter.     Konrad Felix Ward, PA-C [facility] Pre-Surgical Testing [phone]

## 2022-03-02 NOTE — Anesthesia Preprocedure Evaluation (Addendum)
Anesthesia Evaluation  Patient identified by MRN, date of birth, ID band Patient awake    Reviewed: Allergy & Precautions, NPO status , Patient's Chart, lab work & pertinent test results  History of Anesthesia Complications Negative for: history of anesthetic complications  Airway Mallampati: III  TM Distance: >3 FB Neck ROM: Full    Dental  (+) Teeth Intact, Dental Advisory Given   Pulmonary sleep apnea and Continuous Positive Airway Pressure Ventilation    breath sounds clear to auscultation       Cardiovascular hypertension, Pt. on medications (-) angina + CAD and + CABG  + pacemaker  Rhythm:Regular  Pt cleared by cardiology 02/14/2022.    12/2021 Unsuccessful redo para-Hisian PVC ablation with use of cryoablation at  [facility] via [place] complicated by LBBB, CHB necessitating biv pacer implantation    12/2020 TTE (DVAMC) EF 50%, mod LVH, diastolic fx indeterminate, mod LAE, mild  RAE, mild RVE, normal R fx, mod MR, triv TR, RVSP 22 mmHg    Cardiac Cath 11/2021 PROCEDURE SUMMARY 3 vessel and branch vessel CAD with patent LIMA-LAD, SVG-OM2 and SVG-D1, extensive collaterals to isolated distal RCA/PDA/RPLB, superior branch D1, OM3; all essentially unchanged from 2022 diagnostic cath RECOMMENDATIONS 1. Trial of long acting nitrates for small vessel disease and collaterals 2. Proceed with PVC ablation as planned. If PPM is placed consider initiation of beta-blocker for CAD after.  3. Recommended patient re-initiate ASA/statin for secondary prevention     Neuro/Psych negative neurological ROS  negative psych ROS   GI/Hepatic negative GI ROS, Neg liver ROS,,,  Endo/Other  negative endocrine ROS    Renal/GU negative Renal ROSLab Results      Component                Value               Date                      CREATININE               0.69                02/28/2022                Musculoskeletal  (+) Arthritis ,     Abdominal   Peds  Hematology negative hematology ROS (+) Lab Results      Component                Value               Date                      WBC                      6.1                 02/28/2022                HGB                      13.3                02/28/2022                HCT                      40.5  02/28/2022                MCV                      101.3 (H)           02/28/2022                PLT                      215                 02/28/2022              Anesthesia Other Findings   Reproductive/Obstetrics                             Anesthesia Physical Anesthesia Plan  ASA: 3  Anesthesia Plan: MAC and Spinal   Post-op Pain Management: Ofirmev IV (intra-op)*   Induction: Intravenous  PONV Risk Score and Plan: 1 and Propofol infusion and Treatment may vary due to age or medical condition  Airway Management Planned: Nasal Cannula and Natural Airway  Additional Equipment: None  Intra-op Plan:   Post-operative Plan:   Informed Consent: I have reviewed the patients History and Physical, chart, labs and discussed the procedure including the risks, benefits and alternatives for the proposed anesthesia with the patient or authorized representative who has indicated his/her understanding and acceptance.     Dental advisory given  Plan Discussed with: CRNA  Anesthesia Plan Comments: (See PAT note 02/28/2022)       Anesthesia Quick Evaluation

## 2022-03-09 ENCOUNTER — Other Ambulatory Visit: Payer: Self-pay

## 2022-03-09 ENCOUNTER — Observation Stay (HOSPITAL_COMMUNITY)
Admission: RE | Admit: 2022-03-09 | Discharge: 2022-03-10 | Disposition: A | Discharge status: Home / Self Care | Payer: No Typology Code available for payment source | Source: Ambulatory Visit | Attending: Orthopedic Surgery | Admitting: Orthopedic Surgery

## 2022-03-09 ENCOUNTER — Ambulatory Visit (HOSPITAL_BASED_OUTPATIENT_CLINIC_OR_DEPARTMENT_OTHER): Payer: No Typology Code available for payment source | Admitting: Anesthesiology

## 2022-03-09 ENCOUNTER — Ambulatory Visit (HOSPITAL_COMMUNITY): Payer: No Typology Code available for payment source

## 2022-03-09 ENCOUNTER — Ambulatory Visit (HOSPITAL_COMMUNITY): Payer: No Typology Code available for payment source | Admitting: Physician Assistant

## 2022-03-09 ENCOUNTER — Encounter (HOSPITAL_COMMUNITY)
Admission: RE | Disposition: A | Discharge status: Home / Self Care | Payer: Self-pay | Source: Ambulatory Visit | Attending: Orthopedic Surgery

## 2022-03-09 ENCOUNTER — Observation Stay (HOSPITAL_COMMUNITY): Payer: No Typology Code available for payment source

## 2022-03-09 ENCOUNTER — Encounter (HOSPITAL_COMMUNITY): Payer: Self-pay | Admitting: Orthopedic Surgery

## 2022-03-09 DIAGNOSIS — I10 Essential (primary) hypertension: Secondary | ICD-10-CM | POA: Insufficient documentation

## 2022-03-09 DIAGNOSIS — I2510 Atherosclerotic heart disease of native coronary artery without angina pectoris: Secondary | ICD-10-CM | POA: Insufficient documentation

## 2022-03-09 DIAGNOSIS — Z7982 Long term (current) use of aspirin: Secondary | ICD-10-CM | POA: Insufficient documentation

## 2022-03-09 DIAGNOSIS — Z951 Presence of aortocoronary bypass graft: Secondary | ICD-10-CM | POA: Diagnosis not present

## 2022-03-09 DIAGNOSIS — Z85828 Personal history of other malignant neoplasm of skin: Secondary | ICD-10-CM | POA: Diagnosis not present

## 2022-03-09 DIAGNOSIS — Z9989 Dependence on other enabling machines and devices: Secondary | ICD-10-CM

## 2022-03-09 DIAGNOSIS — Z950 Presence of cardiac pacemaker: Secondary | ICD-10-CM | POA: Diagnosis not present

## 2022-03-09 DIAGNOSIS — M1712 Unilateral primary osteoarthritis, left knee: Secondary | ICD-10-CM | POA: Diagnosis present

## 2022-03-09 DIAGNOSIS — Z79899 Other long term (current) drug therapy: Secondary | ICD-10-CM | POA: Diagnosis not present

## 2022-03-09 DIAGNOSIS — M1612 Unilateral primary osteoarthritis, left hip: Principal | ICD-10-CM | POA: Diagnosis present

## 2022-03-09 DIAGNOSIS — G4733 Obstructive sleep apnea (adult) (pediatric): Secondary | ICD-10-CM | POA: Diagnosis not present

## 2022-03-09 DIAGNOSIS — M169 Osteoarthritis of hip, unspecified: Secondary | ICD-10-CM | POA: Diagnosis present

## 2022-03-09 HISTORY — PX: TOTAL HIP ARTHROPLASTY: SHX124

## 2022-03-09 LAB — TYPE AND SCREEN
ABO/RH(D): O POS
Antibody Screen: NEGATIVE

## 2022-03-09 LAB — ABO/RH: ABO/RH(D): O POS

## 2022-03-09 SURGERY — ARTHROPLASTY, HIP, TOTAL, ANTERIOR APPROACH
Anesthesia: Monitor Anesthesia Care | Site: Hip | Laterality: Left

## 2022-03-09 MED ORDER — DEXAMETHASONE SODIUM PHOSPHATE 10 MG/ML IJ SOLN
8.0000 mg | Freq: Once | INTRAMUSCULAR | Status: AC
  Administered 2022-03-09: 8 mg via INTRAVENOUS

## 2022-03-09 MED ORDER — ORAL CARE MOUTH RINSE: 15.0000 mL | OROMUCOSAL | Status: DC | PRN

## 2022-03-09 MED ORDER — PHENYLEPHRINE HCL-NACL 20-0.9 MG/250ML-% IV SOLN
INTRAVENOUS | Status: DC | PRN
  Administered 2022-03-09: 25 ug/min via INTRAVENOUS

## 2022-03-09 MED ORDER — DOCUSATE SODIUM 100 MG PO CAPS
100.0000 mg | ORAL_CAPSULE | Freq: Two times a day (BID) | ORAL | Status: DC
  Administered 2022-03-09 – 2022-03-10 (×2): 100 mg via ORAL
  Filled 2022-03-09 (×2): qty 1

## 2022-03-09 MED ORDER — SODIUM CHLORIDE 0.9 % IV SOLN: INTRAVENOUS | Status: DC

## 2022-03-09 MED ORDER — 0.9 % SODIUM CHLORIDE (POUR BTL) OPTIME
TOPICAL | Status: DC | PRN
  Administered 2022-03-09: 1000 mL

## 2022-03-09 MED ORDER — PHENYLEPHRINE HCL-NACL 20-0.9 MG/250ML-% IV SOLN
INTRAVENOUS | Status: AC
  Filled 2022-03-09: qty 250

## 2022-03-09 MED ORDER — OXYCODONE HCL 5 MG PO TABS: 5.0000 mg | ORAL_TABLET | Freq: Once | ORAL | Status: DC | PRN

## 2022-03-09 MED ORDER — TRANEXAMIC ACID-NACL 1000-0.7 MG/100ML-% IV SOLN
1000.0000 mg | INTRAVENOUS | Status: AC
  Administered 2022-03-09: 1000 mg via INTRAVENOUS
  Filled 2022-03-09: qty 100

## 2022-03-09 MED ORDER — ONDANSETRON HCL 4 MG/2ML IJ SOLN: 4.0000 mg | Freq: Four times a day (QID) | INTRAMUSCULAR | Status: DC | PRN

## 2022-03-09 MED ORDER — POVIDONE-IODINE 10 % EX SWAB
2.0000 | Freq: Once | CUTANEOUS | Status: AC
  Administered 2022-03-09: 2 via TOPICAL

## 2022-03-09 MED ORDER — IRBESARTAN 75 MG PO TABS
75.0000 mg | ORAL_TABLET | Freq: Every day | ORAL | Status: DC
  Administered 2022-03-10: 75 mg via ORAL
  Filled 2022-03-09: qty 1

## 2022-03-09 MED ORDER — ONDANSETRON HCL 4 MG/2ML IJ SOLN
INTRAMUSCULAR | Status: AC
  Filled 2022-03-09: qty 2

## 2022-03-09 MED ORDER — DEXAMETHASONE SODIUM PHOSPHATE 10 MG/ML IJ SOLN
INTRAMUSCULAR | Status: AC
  Filled 2022-03-09: qty 1

## 2022-03-09 MED ORDER — FENTANYL CITRATE PF 50 MCG/ML IJ SOSY: 25.0000 ug | PREFILLED_SYRINGE | INTRAMUSCULAR | Status: DC | PRN

## 2022-03-09 MED ORDER — MAGNESIUM CITRATE PO SOLN: 1.0000 | Freq: Once | ORAL | Status: DC | PRN

## 2022-03-09 MED ORDER — RIVAROXABAN 10 MG PO TABS
10.0000 mg | ORAL_TABLET | Freq: Every day | ORAL | Status: DC
  Administered 2022-03-10: 10 mg via ORAL
  Filled 2022-03-09: qty 1

## 2022-03-09 MED ORDER — ONDANSETRON HCL 4 MG PO TABS: 4.0000 mg | ORAL_TABLET | Freq: Four times a day (QID) | ORAL | Status: DC | PRN

## 2022-03-09 MED ORDER — HYDROCODONE-ACETAMINOPHEN 5-325 MG PO TABS
1.0000 | ORAL_TABLET | ORAL | Status: DC | PRN
  Administered 2022-03-09 – 2022-03-10 (×3): 2 via ORAL
  Filled 2022-03-09 (×3): qty 2

## 2022-03-09 MED ORDER — METOCLOPRAMIDE HCL 5 MG PO TABS: 5.0000 mg | ORAL_TABLET | Freq: Three times a day (TID) | ORAL | Status: DC | PRN

## 2022-03-09 MED ORDER — CEFAZOLIN SODIUM-DEXTROSE 2-4 GM/100ML-% IV SOLN
2.0000 g | Freq: Four times a day (QID) | INTRAVENOUS | Status: AC
  Administered 2022-03-09 (×2): 2 g via INTRAVENOUS
  Filled 2022-03-09 (×2): qty 100

## 2022-03-09 MED ORDER — ACETAMINOPHEN 160 MG/5ML PO SOLN: 1000.0000 mg | Freq: Once | ORAL | Status: DC | PRN

## 2022-03-09 MED ORDER — BUPIVACAINE IN DEXTROSE 0.75-8.25 % IT SOLN
INTRATHECAL | Status: DC | PRN
  Administered 2022-03-09: 1.8 mL via INTRATHECAL

## 2022-03-09 MED ORDER — HYDROCODONE-ACETAMINOPHEN 7.5-325 MG PO TABS: 1.0000 | ORAL_TABLET | ORAL | Status: DC | PRN

## 2022-03-09 MED ORDER — DEXAMETHASONE SODIUM PHOSPHATE 10 MG/ML IJ SOLN
10.0000 mg | Freq: Once | INTRAMUSCULAR | Status: AC
  Administered 2022-03-10: 10 mg via INTRAVENOUS
  Filled 2022-03-09: qty 1

## 2022-03-09 MED ORDER — BISACODYL 10 MG RE SUPP: 10.0000 mg | Freq: Every day | RECTAL | Status: DC | PRN

## 2022-03-09 MED ORDER — WATER FOR IRRIGATION, STERILE IR SOLN
Status: DC | PRN
  Administered 2022-03-09: 2000 mL

## 2022-03-09 MED ORDER — MORPHINE SULFATE (PF) 2 MG/ML IV SOLN: 0.5000 mg | INTRAVENOUS | Status: DC | PRN

## 2022-03-09 MED ORDER — FENTANYL CITRATE (PF) 100 MCG/2ML IJ SOLN
INTRAMUSCULAR | Status: AC
  Filled 2022-03-09: qty 2

## 2022-03-09 MED ORDER — METHOCARBAMOL 1000 MG/10ML IJ SOLN: 500.0000 mg | Freq: Four times a day (QID) | INTRAVENOUS | Status: DC | PRN

## 2022-03-09 MED ORDER — PROPOFOL 500 MG/50ML IV EMUL
INTRAVENOUS | Status: DC | PRN
  Administered 2022-03-09: 100 ug/kg/min via INTRAVENOUS

## 2022-03-09 MED ORDER — ACETAMINOPHEN 10 MG/ML IV SOLN: 1000.0000 mg | Freq: Once | INTRAVENOUS | Status: DC | PRN

## 2022-03-09 MED ORDER — LACTATED RINGERS IV SOLN: INTRAVENOUS | Status: DC

## 2022-03-09 MED ORDER — BUPIVACAINE-EPINEPHRINE (PF) 0.5% -1:200000 IJ SOLN
INTRAMUSCULAR | Status: DC | PRN
  Administered 2022-03-09: 30 mL

## 2022-03-09 MED ORDER — PROPOFOL 1000 MG/100ML IV EMUL
INTRAVENOUS | Status: AC
  Filled 2022-03-09: qty 100

## 2022-03-09 MED ORDER — PHENOL 1.4 % MT LIQD: 1.0000 | OROMUCOSAL | Status: DC | PRN

## 2022-03-09 MED ORDER — POLYETHYLENE GLYCOL 3350 17 G PO PACK: 17.0000 g | PACK | Freq: Every day | ORAL | Status: DC | PRN

## 2022-03-09 MED ORDER — ACETAMINOPHEN 325 MG PO TABS: 325.0000 mg | ORAL_TABLET | Freq: Four times a day (QID) | ORAL | Status: DC | PRN

## 2022-03-09 MED ORDER — ACETAMINOPHEN 500 MG PO TABS: 1000.0000 mg | ORAL_TABLET | Freq: Once | ORAL | Status: DC | PRN

## 2022-03-09 MED ORDER — ONDANSETRON HCL 4 MG/2ML IJ SOLN
INTRAMUSCULAR | Status: DC | PRN
  Administered 2022-03-09: 4 mg via INTRAVENOUS

## 2022-03-09 MED ORDER — ACETAMINOPHEN 10 MG/ML IV SOLN
1000.0000 mg | Freq: Once | INTRAVENOUS | Status: AC
  Administered 2022-03-09: 1000 mg via INTRAVENOUS
  Filled 2022-03-09: qty 100

## 2022-03-09 MED ORDER — OXYCODONE HCL 5 MG/5ML PO SOLN: 5.0000 mg | Freq: Once | ORAL | Status: DC | PRN

## 2022-03-09 MED ORDER — METOCLOPRAMIDE HCL 5 MG/ML IJ SOLN: 5.0000 mg | Freq: Three times a day (TID) | INTRAMUSCULAR | Status: DC | PRN

## 2022-03-09 MED ORDER — MENTHOL 3 MG MT LOZG: 1.0000 | LOZENGE | OROMUCOSAL | Status: DC | PRN

## 2022-03-09 MED ORDER — TAMSULOSIN HCL 0.4 MG PO CAPS
0.4000 mg | ORAL_CAPSULE | Freq: Every day | ORAL | Status: DC
  Administered 2022-03-09 – 2022-03-10 (×2): 0.4 mg via ORAL
  Filled 2022-03-09 (×2): qty 1

## 2022-03-09 MED ORDER — ORAL CARE MOUTH RINSE: 15.0000 mL | Freq: Once | OROMUCOSAL | Status: AC

## 2022-03-09 MED ORDER — CEFAZOLIN SODIUM-DEXTROSE 2-4 GM/100ML-% IV SOLN
2.0000 g | INTRAVENOUS | Status: AC
  Administered 2022-03-09: 2 g via INTRAVENOUS
  Filled 2022-03-09: qty 100

## 2022-03-09 MED ORDER — CHLORHEXIDINE GLUCONATE 0.12 % MT SOLN
15.0000 mL | Freq: Once | OROMUCOSAL | Status: AC
  Administered 2022-03-09: 15 mL via OROMUCOSAL

## 2022-03-09 MED ORDER — METHOCARBAMOL 500 MG PO TABS
500.0000 mg | ORAL_TABLET | Freq: Four times a day (QID) | ORAL | Status: DC | PRN
  Administered 2022-03-09 – 2022-03-10 (×2): 500 mg via ORAL
  Filled 2022-03-09 (×2): qty 1

## 2022-03-09 MED ORDER — FENTANYL CITRATE (PF) 100 MCG/2ML IJ SOLN
INTRAMUSCULAR | Status: DC | PRN
  Administered 2022-03-09: 50 ug via INTRAVENOUS

## 2022-03-09 MED ORDER — TRAMADOL HCL 50 MG PO TABS
50.0000 mg | ORAL_TABLET | Freq: Four times a day (QID) | ORAL | Status: DC | PRN
  Administered 2022-03-09 – 2022-03-10 (×2): 100 mg via ORAL
  Filled 2022-03-09 (×2): qty 2

## 2022-03-09 SURGICAL SUPPLY — 43 items
BAG COUNTER SPONGE SURGICOUNT (BAG) IMPLANT
BAG DECANTER FOR FLEXI CONT (MISCELLANEOUS) IMPLANT
BAG SPEC THK2 15X12 ZIP CLS (MISCELLANEOUS)
BAG SPNG CNTER NS LX DISP (BAG)
BAG ZIPLOCK 12X15 (MISCELLANEOUS) IMPLANT
BLADE SAG 18X100X1.27 (BLADE) ×1 IMPLANT
COVER PERINEAL POST (MISCELLANEOUS) ×1 IMPLANT
COVER SURGICAL LIGHT HANDLE (MISCELLANEOUS) ×1 IMPLANT
CUP ACETBLR 52 OD PINNACLE (Hips) IMPLANT
DRAPE FOOT SWITCH (DRAPES) ×1 IMPLANT
DRAPE STERI IOBAN 125X83 (DRAPES) ×1 IMPLANT
DRAPE U-SHAPE 47X51 STRL (DRAPES) ×2 IMPLANT
DRSG AQUACEL AG ADV 3.5X10 (GAUZE/BANDAGES/DRESSINGS) ×1 IMPLANT
DURAPREP 26ML APPLICATOR (WOUND CARE) ×1 IMPLANT
ELECT REM PT RETURN 15FT ADLT (MISCELLANEOUS) ×1 IMPLANT
GLOVE BIO SURGEON STRL SZ 6.5 (GLOVE) IMPLANT
GLOVE BIO SURGEON STRL SZ7.5 (GLOVE) IMPLANT
GLOVE BIO SURGEON STRL SZ8 (GLOVE) ×1 IMPLANT
GLOVE BIOGEL PI IND STRL 6.5 (GLOVE) IMPLANT
GLOVE BIOGEL PI IND STRL 7.0 (GLOVE) IMPLANT
GLOVE BIOGEL PI IND STRL 8 (GLOVE) ×1 IMPLANT
GOWN STRL REUS W/ TWL LRG LVL3 (GOWN DISPOSABLE) ×1 IMPLANT
GOWN STRL REUS W/ TWL XL LVL3 (GOWN DISPOSABLE) IMPLANT
GOWN STRL REUS W/TWL LRG LVL3 (GOWN DISPOSABLE) ×1
GOWN STRL REUS W/TWL XL LVL3 (GOWN DISPOSABLE)
HEAD FEM STD 32X+9 STRL (Hips) IMPLANT
HOLDER FOLEY CATH W/STRAP (MISCELLANEOUS) ×1 IMPLANT
KIT TURNOVER KIT A (KITS) IMPLANT
LINER MARATHON NEUT +4X52X32 (Hips) IMPLANT
MANIFOLD NEPTUNE II (INSTRUMENTS) ×1 IMPLANT
PACK ANTERIOR HIP CUSTOM (KITS) ×1 IMPLANT
PENCIL SMOKE EVACUATOR COATED (MISCELLANEOUS) ×1 IMPLANT
SPIKE FLUID TRANSFER (MISCELLANEOUS) ×1 IMPLANT
STEM FEMORAL SZ6 HIGH ACTIS (Stem) IMPLANT
STRIP CLOSURE SKIN 1/2X4 (GAUZE/BANDAGES/DRESSINGS) ×1 IMPLANT
SUT ETHIBOND NAB CT1 #1 30IN (SUTURE) ×1 IMPLANT
SUT MNCRL AB 4-0 PS2 18 (SUTURE) ×1 IMPLANT
SUT STRATAFIX 0 PDS 27 VIOLET (SUTURE) ×1
SUT VIC AB 2-0 CT1 27 (SUTURE) ×2
SUT VIC AB 2-0 CT1 TAPERPNT 27 (SUTURE) ×2 IMPLANT
SUTURE STRATFX 0 PDS 27 VIOLET (SUTURE) ×1 IMPLANT
TRAY FOLEY MTR SLVR 16FR STAT (SET/KITS/TRAYS/PACK) ×1 IMPLANT
TUBE SUCTION HIGH CAP CLEAR NV (SUCTIONS) ×1 IMPLANT

## 2022-03-09 NOTE — Transfer of Care (Signed)
Immediate Anesthesia Transfer of Care Note  Patient: Richard Camacho  Procedure(s) Performed: TOTAL HIP ARTHROPLASTY ANTERIOR APPROACH (Left: Hip)  Patient Location: PACU  Anesthesia Type:Spinal  Level of Consciousness: sedated  Airway & Oxygen Therapy: Patient Spontanous Breathing and Patient connected to face mask oxygen  Post-op Assessment: Report given to RN and Post -op Vital signs reviewed and stable  Post vital signs: Reviewed and stable  Last Vitals:  Vitals Value Taken Time  BP 108/64 03/09/22 1425  Temp    Pulse 62 03/09/22 1427  Resp 27 03/09/22 1427  SpO2 100 % 03/09/22 1427  Vitals shown include unvalidated device data.  Last Pain:  Vitals:   03/09/22 1053  TempSrc:   PainSc: 0-No pain         Complications: No notable events documented.

## 2022-03-09 NOTE — Discharge Instructions (Addendum)
Frank Aluisio, MD Total Joint Specialist [facility] [address] [place], [place] [address] [phone]  ANTERIOR APPROACH TOTAL HIP REPLACEMENT POSTOPERATIVE DIRECTIONS     Hip Rehabilitation, Guidelines Following Surgery  The results of a hip operation are greatly improved after range of motion and muscle strengthening exercises. Follow all safety measures which are given to protect your hip. If any of these exercises cause increased pain or swelling in your joint, decrease the amount until you are comfortable again. Then slowly increase the exercises. Call your caregiver if you have problems or questions.   BLOOD CLOT PREVENTION Take a 10 mg Xarelto once a day for three weeks following surgery. Then take an 81 mg Aspirin once a day for three weeks. Then discontinue Aspirin. You may resume your vitamins/supplements once you have discontinued the Xarelto. Do not take any NSAIDs (Advil, Aleve, Ibuprofen, Meloxicam, etc.) until you have discontinued the Xarelto.   HOME CARE INSTRUCTIONS  Remove items at home which could result in a fall. This includes throw rugs or furniture in walking pathways.  ICE to the affected hip as frequently as 20-30 minutes an hour and then as needed for pain and swelling. Continue to use ice on the hip for pain and swelling from surgery. You may notice swelling that will progress down to the foot and ankle. This is normal after surgery. Elevate the leg when you are not up walking on it.   Continue to use the breathing machine which will help keep your temperature down.  It is common for your temperature to cycle up and down following surgery, especially at night when you are not up moving around and exerting yourself.  The breathing machine keeps your lungs expanded and your temperature down.  DIET You may resume your previous home diet once your are discharged from the hospital.  DRESSING / WOUND CARE / SHOWERING You have an  adhesive waterproof bandage over the incision. Leave this in place until your first follow-up appointment. Once you remove this you will not need to place another bandage.  You may begin showering 3 days following surgery, but do not submerge the incision under water.  ACTIVITY For the first 3-5 days, it is important to rest and keep the operative leg elevated. You should, as a general rule, rest for 50 minutes and walk/stretch for 10 minutes per hour. After 5 days, you may slowly increase activity as tolerated.  Perform the exercises you were provided twice a day for about 15-20 minutes each session. Begin these 2 days following surgery. Walk with your walker as instructed. Use the walker until you are comfortable transitioning to a cane. Walk with the cane in the opposite hand of the operative leg. You may discontinue the cane once you are comfortable and walking steadily. Avoid periods of inactivity such as sitting longer than an hour when not asleep. This helps prevent blood clots.  Do not drive a car for 6 weeks or until released by your surgeon.  Do not drive while taking narcotics.  TED HOSE STOCKINGS Wear the elastic stockings on both legs for three weeks following surgery during the day. You may remove them at night while sleeping.  WEIGHT BEARING Weight bearing as tolerated with assist device (walker, cane, etc) as directed, use it as long as suggested by your surgeon or therapist, typically at least 4-6 weeks.  POSTOPERATIVE CONSTIPATION PROTOCOL Constipation - defined medically as fewer than three stools per week and severe constipation as less   than one stool per week.  One of the most common issues patients have following surgery is constipation.  Even if you have a regular bowel pattern at home, your normal regimen is likely to be disrupted due to multiple reasons following surgery.  Combination of anesthesia, postoperative narcotics, change in appetite and fluid intake all can  affect your bowels.  In order to avoid complications following surgery, here are some recommendations in order to help you during your recovery period.  Colace (docusate) - Pick up an over-the-counter form of Colace or another stool softener and take twice a day as long as you are requiring postoperative pain medications.  Take with a full glass of water daily.  If you experience loose stools or diarrhea, hold the colace until you stool forms back up.  If your symptoms do not get better within 1 week or if they get worse, check with your doctor. Dulcolax (bisacodyl) - Pick up over-the-counter and take as directed by the product packaging as needed to assist with the movement of your bowels.  Take with a full glass of water.  Use this product as needed if not relieved by Colace only.  MiraLax (polyethylene glycol) - Pick up over-the-counter to have on hand.  MiraLax is a solution that will increase the amount of water in your bowels to assist with bowel movements.  Take as directed and can mix with a glass of water, juice, soda, coffee, or tea.  Take if you go more than two days without a movement.Do not use MiraLax more than once per day. Call your doctor if you are still constipated or irregular after using this medication for 7 days in a row.  If you continue to have problems with postoperative constipation, please contact the office for further assistance and recommendations.  If you experience "the worst abdominal pain ever" or develop nausea or vomiting, please contact the office immediatly for further recommendations for treatment.  ITCHING  If you experience itching with your medications, try taking only a single pain pill, or even half a pain pill at a time.  You can also use Benadryl over the counter for itching or also to help with sleep.   MEDICATIONS See your medication summary on the "After Visit Summary" that the nursing staff will review with you prior to discharge.  You may have some home  medications which will be placed on hold until you complete the course of blood thinner medication.  It is important for you to complete the blood thinner medication as prescribed by your surgeon.  Continue your approved medications as instructed at time of discharge.  PRECAUTIONS If you experience chest pain or shortness of breath - call 911 immediately for transfer to the hospital emergency department.  If you develop a fever greater that 101 F, purulent drainage from wound, increased redness or drainage from wound, foul odor from the wound/dressing, or calf pain - CONTACT YOUR SURGEON.                                                   FOLLOW-UP APPOINTMENTS Make sure you keep all of your appointments after your operation with your surgeon and caregivers. You should call the office at the above phone number and make an appointment for approximately two weeks after the date of your surgery or on the date  instructed by your surgeon outlined in the "After Visit Summary".  RANGE OF MOTION AND STRENGTHENING EXERCISES  These exercises are designed to help you keep full movement of your hip joint. Follow your caregiver's or physical therapist's instructions. Perform all exercises about fifteen times, three times per day or as directed. Exercise both hips, even if you have had only one joint replacement. These exercises can be done on a training (exercise) mat, on the floor, on a table or on a bed. Use whatever works the best and is most comfortable for you. Use music or television while you are exercising so that the exercises are a pleasant break in your day. This will make your life better with the exercises acting as a break in routine you can look forward to.  Lying on your back, slowly slide your foot toward your buttocks, raising your knee up off the floor. Then slowly slide your foot back down until your leg is straight again.  Lying on your back spread your legs as far apart as you can without causing  discomfort.  Lying on your side, raise your upper leg and foot straight up from the floor as far as is comfortable. Slowly lower the leg and repeat.  Lying on your back, tighten up the muscle in the front of your thigh (quadriceps muscles). You can do this by keeping your leg straight and trying to raise your heel off the floor. This helps strengthen the largest muscle supporting your knee.  Lying on your back, tighten up the muscles of your buttocks both with the legs straight and with the knee bent at a comfortable angle while keeping your heel on the floor.   POST-OPERATIVE OPIOID TAPER INSTRUCTIONS: It is important to wean off of your opioid medication as soon as possible. If you do not need pain medication after your surgery it is ok to stop day one. Opioids include: Codeine, Hydrocodone(Norco, Vicodin), Oxycodone(Percocet, oxycontin) and hydromorphone amongst others.  Long term and even short term use of opiods can cause: Increased pain response Dependence Constipation Depression Respiratory depression And more.  Withdrawal symptoms can include Flu like symptoms Nausea, vomiting And more Techniques to manage these symptoms Hydrate well Eat regular healthy meals Stay active Use relaxation techniques(deep breathing, meditating, yoga) Do Not substitute Alcohol to help with tapering If you have been on opioids for less than two weeks and do not have pain than it is ok to stop all together.  Plan to wean off of opioids This plan should start within one week post op of your joint replacement. Maintain the same interval or time between taking each dose and first decrease the dose.  Cut the total daily intake of opioids by one tablet each day Next start to increase the time between doses. The last dose that should be eliminated is the evening dose.   IF YOU ARE TRANSFERRED TO A SKILLED REHAB FACILITY If the patient is transferred to a skilled rehab facility following release from the  hospital, a list of the current medications will be sent to the facility for the patient to continue.  When discharged from the skilled rehab facility, please have the facility set up the patient's Home Health Physical Therapy prior to being released. Also, the skilled facility will be responsible for providing the patient with their medications at time of release from the facility to include their pain medication, the muscle relaxants, and their blood thinner medication. If the patient is still at the rehab facility at   time of the two week follow up appointment, the skilled rehab facility will also need to assist the patient in arranging follow up appointment in our office and any transportation needs.  MAKE SURE YOU:  Understand these instructions.  Get help right away if you are not doing well or get worse.    DENTAL ANTIBIOTICS:  In most cases prophylactic antibiotics for Dental procdeures after total joint surgery are not necessary.  Exceptions are as follows:  1. History of prior total joint infection  2. Severely immunocompromised (Organ Transplant, cancer chemotherapy, Rheumatoid biologic meds such as Aragon)  3. Poorly controlled diabetes (A1C &gt; 8.0, blood glucose over 200)  If you have one of these conditions, contact your surgeon for an antibiotic prescription, prior to your dental procedure.    Pick up stool softner and laxative for home use following surgery while on pain medications. Do not submerge incision under water. Please use good hand washing techniques while changing dressing each day. May shower starting three days after surgery. Please use a clean towel to pat the incision dry following showers. Continue to use ice for pain and swelling after surgery. Do not use any lotions or creams on the incision until instructed by your surgeon.   Information on my medicine - XARELTO (Rivaroxaban) Why was Xarelto prescribed for you? Xarelto was prescribed for you  to reduce the risk of blood clots forming after orthopedic surgery. The medical term for these abnormal blood clots is venous thromboembolism (VTE).  What do you need to know about xarelto ? Take your Xarelto ONCE DAILY at the same time every day. You may take it either with or without food.  If you have difficulty swallowing the tablet whole, you may crush it and mix in applesauce just prior to taking your dose.  Take Xarelto exactly as prescribed by your doctor and DO NOT stop taking Xarelto without talking to the doctor who prescribed the medication.  Stopping without other VTE prevention medication to take the place of Xarelto may increase your risk of developing a clot.  After discharge, you should have regular check-up appointments with your healthcare provider that is prescribing your Xarelto.    What do you do if you miss a dose? If you miss a dose, take it as soon as you remember on the same day then continue your regularly scheduled once daily regimen the next day. Do not take two doses of Xarelto on the same day.   Important Safety Information A possible side effect of Xarelto is bleeding. You should call your healthcare provider right away if you experience any of the following: Bleeding from an injury or your nose that does not stop. Unusual colored urine (red or dark brown) or unusual colored stools (red or black). Unusual bruising for unknown reasons. A serious fall or if you hit your head (even if there is no bleeding).  Some medicines may interact with Xarelto and might increase your risk of bleeding while on Xarelto. To help avoid this, consult your healthcare provider or pharmacist prior to using any new prescription or non-prescription medications, including herbals, vitamins, non-steroidal anti-inflammatory drugs (NSAIDs) and supplements.  This website has more information on Xarelto: [email or web address]

## 2022-03-09 NOTE — Evaluation (Signed)
Physical Therapy Evaluation Patient Details Name: Richard Camacho MRN: [id number] DOB: 11/29/1945 Today's Date: 03/09/2022  History of Present Illness  Pt is a 76yo male presenting s/p L-THAAA on 03/09/22. PMH: hx of skin cancer, CAD, CABG, HTN, pacemaker 9/23.  Clinical Impression  Richard Camacho is a 76 y.o. male POD 0 s/p L-THA, AA. Patient reports modified independence using SPC with mobility at baseline. Patient is now limited by functional impairments (see PT problem list below) and requires supervision for bed mobility and min guard for transfers; further mobility deferred as pt still experiencing reduced sensation with LLE. Patient instructed in exercise to facilitate ROM and circulation to manage edema. Provided incentive spirometer and with Vcs pt able to achieve 2579m. Patient will benefit from continued skilled PT interventions to address impairments and progress towards PLOF. Acute PT will follow to progress mobility and HEP in preparation for safe discharge home.       Recommendations for follow up therapy are one component of a multi-disciplinary discharge planning process, led by the attending physician.  Recommendations may be updated based on patient status, additional functional criteria and insurance authorization.  Follow Up Recommendations Follow physician's recommendations for discharge plan and follow up therapies      Assistance Recommended at Discharge Frequent or constant Supervision/Assistance  Patient can return home with the following  A little help with walking and/or transfers;A little help with bathing/dressing/bathroom;Assistance with cooking/housework;Help with stairs or ramp for entrance;Assist for transportation    Equipment Recommendations Rolling walker (2 wheels)  Recommendations for Other Services       Functional Status Assessment Patient has had a recent decline in their functional status and demonstrates the ability to make significant  improvements in function in a reasonable and predictable amount of time.     Precautions / Restrictions Precautions Precautions: Fall Restrictions Weight Bearing Restrictions: No Other Position/Activity Restrictions: wbat      Mobility  Bed Mobility Overal bed mobility: Needs Assistance Bed Mobility: Supine to Sit     Supine to sit: Supervision     General bed mobility comments: for safety only    Transfers Overall transfer level: Needs assistance Equipment used: Rolling walker (2 wheels) Transfers: Sit to/from Stand, Bed to chair/wheelchair/BSC Sit to Stand: Min guard, From elevated surface   Step pivot transfers: Min guard       General transfer comment: Min guard for safety, VCs for seqeuencing    Ambulation/Gait               General Gait Details: deferred  Stairs            Wheelchair Mobility    Modified Rankin (Stroke Patients Only)       Balance Overall balance assessment: Needs assistance Sitting-balance support: Feet supported, No upper extremity supported Sitting balance-Leahy Scale: Normal     Standing balance support: During functional activity, No upper extremity supported Standing balance-Leahy Scale: Fair Standing balance comment: Pt able to stand statically without BUE, required BUE support on RW                             Pertinent Vitals/Pain Pain Assessment Pain Assessment: 0-10 Pain Score: 0-No pain    Home Living Family/patient expects to be discharged to:: Private residence Living Arrangements: Alone Available Help at Discharge: Friend(s);Family;Available 24 hours/day ([place]) Type of Home: House Home Access: Level entry       Home Layout: Able to live on  main level with bedroom/bathroom Home Equipment: Shower seat;Grab bars - tub/shower;Cane - single point      Prior Function Prior Level of Function : Independent/Modified Independent             Mobility Comments: SPC ADLs Comments:  IND     Hand Dominance        Extremity/Trunk Assessment   Upper Extremity Assessment Upper Extremity Assessment: Overall WFL for tasks assessed    Lower Extremity Assessment Lower Extremity Assessment: LLE deficits/detail;RLE deficits/detail RLE Deficits / Details: MMT ank DF/PF 5/5 RLE Sensation: WNL LLE Deficits / Details: MMT ank DF/PF 5/5 LLE Sensation: decreased light touch (Numb in b/l glutes with decreased light touch on plantar surface of foot)    Cervical / Trunk Assessment Cervical / Trunk Assessment: Normal  Communication   Communication: No difficulties  Cognition Arousal/Alertness: Awake/alert Behavior During Therapy: WFL for tasks assessed/performed Overall Cognitive Status: Within Functional Limits for tasks assessed                                          General Comments General comments (skin integrity, edema, etc.): Children and SO present    Exercises Total Joint Exercises Ankle Circles/Pumps: AROM, Both, 20 reps   Assessment/Plan    PT Assessment Patient needs continued PT services  PT Problem List Decreased strength;Decreased range of motion;Decreased activity tolerance;Decreased balance;Decreased mobility;Decreased coordination;Pain       PT Treatment Interventions DME instruction;Gait training;Stair training;Functional mobility training;Therapeutic activities;Therapeutic exercise;Balance training;Neuromuscular re-education;Patient/family education    PT Goals (Current goals can be found in the Care Plan section)  Acute Rehab PT Goals Patient Stated Goal: golf, walk without pain PT Goal Formulation: With patient Time For Goal Achievement: 03/16/22 Potential to Achieve Goals: Good    Frequency 7X/week     Co-evaluation               AM-PAC PT "6 Clicks" Mobility  Outcome Measure Help needed turning from your back to your side while in a flat bed without using bedrails?: None Help needed moving from lying on  your back to sitting on the side of a flat bed without using bedrails?: None Help needed moving to and from a bed to a chair (including a wheelchair)?: A Little Help needed standing up from a chair using your arms (e.g., wheelchair or bedside chair)?: A Little Help needed to walk in hospital room?: A Little Help needed climbing 3-5 steps with a railing? : A Little 6 Click Score: 20    End of Session Equipment Utilized During Treatment: Gait belt Activity Tolerance: Patient tolerated treatment well;No increased pain Patient left: in chair;with call bell/phone within reach;with chair alarm set;with family/visitor present;with SCD's reapplied Nurse Communication: Mobility status PT Visit Diagnosis: Pain;Difficulty in walking, not elsewhere classified (R26.2) Pain - Right/Left: Left Pain - part of body: Hip    Time: 1750-1816 PT Time Calculation (min) (ACUTE ONLY): 26 min   Charges:   PT Evaluation $PT Eval Low Complexity: 1 Low PT Treatments $Therapeutic Activity: 8-22 mins       Coolidge Breeze, PT, DPT [facility] Department Office: [phone] Weekend pager: [phone]  Coolidge Breeze 03/09/2022, 6:23 PM

## 2022-03-09 NOTE — Anesthesia Postprocedure Evaluation (Signed)
Anesthesia Post Note  Patient: EDOARDO LAFORTE  Procedure(s) Performed: TOTAL HIP ARTHROPLASTY ANTERIOR APPROACH (Left: Hip)     Patient location during evaluation: PACU Anesthesia Type: MAC and Spinal Level of consciousness: awake and alert Pain management: pain level controlled Vital Signs Assessment: post-procedure vital signs reviewed and stable Respiratory status: spontaneous breathing, nonlabored ventilation and respiratory function stable Cardiovascular status: stable and blood pressure returned to baseline Postop Assessment: no apparent nausea or vomiting Anesthetic complications: no   No notable events documented.  Last Vitals:  Vitals:   03/09/22 1541 03/09/22 1753  BP: (!) 140/70 (!) 146/80  Pulse: 71 69  Resp: 17 18  Temp: 36.5 C 36.7 C  SpO2: 100% 97%    Last Pain:  Vitals:   03/09/22 1753  TempSrc: Oral  PainSc:                  Quintavius Niebuhr

## 2022-03-09 NOTE — Op Note (Signed)
OPERATIVE REPORT- TOTAL HIP ARTHROPLASTY   PREOPERATIVE DIAGNOSIS: Osteoarthritis of the Left hip.   POSTOPERATIVE DIAGNOSIS: Osteoarthritis of the Left  hip.   PROCEDURE: Left total hip arthroplasty, anterior approach.   SURGEON: Gaynelle Arabian, MD   ASSISTANT: Jaynie Bream, PA-C  ANESTHESIA:  Spinal  ESTIMATED BLOOD LOSS:-500 mL    DRAINS: None  COMPLICATIONS: None   CONDITION: PACU - hemodynamically stable.   BRIEF CLINICAL NOTE: Richard Camacho is a 76 y.o. male who has advanced end-  stage arthritis of their Left  hip with progressively worsening pain and  dysfunction.The patient has failed nonoperative management and presents for  total hip arthroplasty.   PROCEDURE IN DETAIL: After successful administration of spinal  anesthetic, the traction boots for the [facility] bed were placed on both  feet and the patient was placed onto the [facility] bed, boots placed into the leg  holders. The Left hip was then isolated from the perineum with plastic  drapes and prepped and draped in the usual sterile fashion. ASIS and  greater trochanter were marked and a oblique incision was made, starting  at about 1 cm lateral and 2 cm distal to the ASIS and coursing towards  the anterior cortex of the femur. The skin was cut with a 10 blade  through subcutaneous tissue to the level of the fascia overlying the  tensor fascia lata muscle. The fascia was then incised in line with the  incision at the junction of the anterior third and posterior 2/3rd. The  muscle was teased off the fascia and then the interval between the TFL  and the rectus was developed. The Hohmann retractor was then placed at  the top of the femoral neck over the capsule. The vessels overlying the  capsule were cauterized and the fat on top of the capsule was removed.  A Hohmann retractor was then placed anterior underneath the rectus  femoris to give exposure to the entire anterior capsule. A T-shaped  capsulotomy was  performed. The edges were tagged and the femoral head  was identified.       Osteophytes are removed off the superior acetabulum.  The femoral neck was then cut in situ with an oscillating saw. Traction  was then applied to the left lower extremity utilizing the [facility]  traction. The femoral head was then removed. Retractors were placed  around the acetabulum and then circumferential removal of the labrum was  performed. Osteophytes were also removed. Reaming starts at 49 mm to  medialize and  Increased in 2 mm increments to 51 mm. We reamed in  approximately 40 degrees of abduction, 20 degrees anteversion. A 52 mm  pinnacle acetabular shell was then impacted in anatomic position under  fluoroscopic guidance with excellent purchase. We did not need to place  any additional dome screws. A 32 mm neutral + 4 marathon liner was then  placed into the acetabular shell.       The femoral lift was then placed along the lateral aspect of the femur  just distal to the vastus ridge. The leg was  externally rotated and capsule  was stripped off the inferior aspect of the femoral neck down to the  level of the lesser trochanter, this was done with electrocautery. The femur was lifted after this was performed. The  leg was then placed in an extended and adducted position essentially delivering the femur. We also removed the capsule superiorly and the piriformis from the piriformis fossa to  gain excellent exposure of the  proximal femur. Rongeur was used to remove some cancellous bone to get  into the lateral portion of the proximal femur for placement of the  initial starter reamer. The starter broaches was placed  the starter broach  and was shown to go down the center of the canal. Broaching  with the Actis system was then performed starting at size 0  coursing  Up to size 6. A size 6 had excellent torsional and rotational  and axial stability. The trial high offset neck was then placed  with a 32 + 8.5  trial head. The hip was then reduced. We confirmed that  the stem was in the canal both on AP and lateral x-rays. It also has excellent sizing. The hip was reduced with outstanding stability through full extension and full external rotation.. AP pelvis was taken and the leg lengths were measured and found to be equal. Hip was then dislocated again and the femoral head and neck removed. The  femoral broach was removed. Size 6 Actis stem with a high offset  neck was then impacted into the femur following native anteversion. Has  excellent purchase in the canal. Excellent torsional and rotational and  axial stability. It is confirmed to be in the canal on AP and lateral  fluoroscopic views. The 32 + 8.5 ceramic head was placed and the hip  reduced with outstanding stability. Again AP pelvis was taken and it  confirmed that the leg lengths were equal. The wound was then copiously  irrigated with saline solution and the capsule reattached and repaired  with Ethibond suture. 30 ml of .25% Bupivicaine was  injected into the capsule and into the edge of the tensor fascia lata as well as subcutaneous tissue. The fascia overlying the tensor fascia lata was then closed with a running #1 V-Loc. Subcu was closed with interrupted 2-0 Vicryl and subcuticular running 4-0 Monocryl. Incision was cleaned  and dried. Steri-Strips and a bulky sterile dressing applied. The patient was awakened and transported to  recovery in stable condition.        Please note that a surgical assistant was a medical necessity for this procedure to perform it in a safe and expeditious manner. Assistant was necessary to provide appropriate retraction of vital neurovascular structures and to prevent femoral fracture and allow for anatomic placement of the prosthesis.  Gaynelle Arabian, M.D.

## 2022-03-09 NOTE — Anesthesia Procedure Notes (Signed)
Spinal  Patient location during procedure: OR Start time: 03/09/2022 12:49 PM End time: 03/09/2022 10:51 AM Reason for block: surgical anesthesia Staffing Performed: resident/CRNA  Anesthesiologist: Oleta Mouse, MD Resident/CRNA: Lind Covert, CRNA Performed by: Lind Covert, CRNA Authorized by: Oleta Mouse, MD   Preanesthetic Checklist Completed: patient identified, IV checked, site marked, risks and benefits discussed, surgical consent, monitors and equipment checked, pre-op evaluation and timeout performed Spinal Block Patient position: sitting Prep: DuraPrep Patient monitoring: heart rate, cardiac monitor, continuous pulse ox and blood pressure Approach: midline Location: L3-4 Injection technique: single-shot Needle Needle type: Pencan  Needle gauge: 24 G Needle length: 10 cm Needle insertion depth: 8 cm Assessment Sensory level: T6 Events: CSF return Additional Notes Timeout performed. Patient in sitting position. L 3-4 identified. Cleansed with Duraprep. SAB without difficulty. To supine position

## 2022-03-09 NOTE — Interval H&P Note (Signed)
History and Physical Interval Note:  03/09/2022 10:17 AM  Richard Camacho  has presented today for surgery, with the diagnosis of left hip osteoarthritis.  The various methods of treatment have been discussed with the patient and family. After consideration of risks, benefits and other options for treatment, the patient has consented to  Procedure(s): TOTAL HIP ARTHROPLASTY ANTERIOR APPROACH (Left) as a surgical intervention.  The patient's history has been reviewed, patient examined, no change in status, stable for surgery.  I have reviewed the patient's chart and labs.  Questions were answered to the patient's satisfaction.     Pilar Plate [name]

## 2022-03-10 ENCOUNTER — Encounter (HOSPITAL_COMMUNITY): Payer: Self-pay | Admitting: Orthopedic Surgery

## 2022-03-10 ENCOUNTER — Other Ambulatory Visit (HOSPITAL_COMMUNITY): Payer: Self-pay

## 2022-03-10 DIAGNOSIS — M1612 Unilateral primary osteoarthritis, left hip: Secondary | ICD-10-CM | POA: Diagnosis not present

## 2022-03-10 LAB — CBC
HCT: 35.1 % — ABNORMAL LOW (ref 39.0–52.0)
Hemoglobin: 12.1 g/dL — ABNORMAL LOW (ref 13.0–17.0)
MCH: 34.4 pg — ABNORMAL HIGH (ref 26.0–34.0)
MCHC: 34.5 g/dL (ref 30.0–36.0)
MCV: 99.7 fL (ref 80.0–100.0)
Platelets: 190 10*3/uL (ref 150–400)
RBC: 3.52 MIL/uL — ABNORMAL LOW (ref 4.22–5.81)
RDW: 12.2 % (ref 11.5–15.5)
WBC: 13.6 10*3/uL — ABNORMAL HIGH (ref 4.0–10.5)
nRBC: 0 % (ref 0.0–0.2)

## 2022-03-10 LAB — BASIC METABOLIC PANEL
Anion gap: 8 (ref 5–15)
BUN: 18 mg/dL (ref 8–23)
CO2: 24 mmol/L (ref 22–32)
Calcium: 9.5 mg/dL (ref 8.9–10.3)
Chloride: 108 mmol/L (ref 98–111)
Creatinine, Ser: 0.7 mg/dL (ref 0.61–1.24)
GFR, Estimated: >60 mL/min (ref >60)
Glucose, Bld: 125 mg/dL — ABNORMAL HIGH (ref 70–99)
Potassium: 4.3 mmol/L (ref 3.5–5.1)
Sodium: 140 mmol/L (ref 135–145)

## 2022-03-10 MED ORDER — RIVAROXABAN 10 MG PO TABS: 10.0000 mg | ORAL_TABLET | Freq: Every day | ORAL | 0 refills | Status: AC

## 2022-03-10 MED ORDER — METHOCARBAMOL 500 MG PO TABS: 500.0000 mg | ORAL_TABLET | Freq: Four times a day (QID) | ORAL | 0 refills | Status: DC | PRN

## 2022-03-10 MED ORDER — TRAMADOL HCL 50 MG PO TABS: 50.0000 mg | ORAL_TABLET | Freq: Four times a day (QID) | ORAL | 0 refills | Status: DC | PRN

## 2022-03-10 MED ORDER — HYDROCODONE-ACETAMINOPHEN 5-325 MG PO TABS: 1.0000 | ORAL_TABLET | Freq: Four times a day (QID) | ORAL | 0 refills | Status: DC | PRN

## 2022-03-10 NOTE — Progress Notes (Signed)
Physical Therapy Treatment Patient Details Name: Richard Camacho MRN: [id number] DOB: 1945/08/24 Today's Date: 03/10/2022   History of Present Illness Pt is a 76yo male presenting s/p L-THAAA on 03/09/22. PMH: hx of skin cancer, CAD, CABG, HTN, pacemaker 9/23.    PT Comments    Pt very motivated and progressing well with mobility.  Pt performed HEP with assist and up to ambulate in hall.  Pt eager for return home this date.   Recommendations for follow up therapy are one component of a multi-disciplinary discharge planning process, led by the attending physician.  Recommendations may be updated based on patient status, additional functional criteria and insurance authorization.  Follow Up Recommendations  Follow physician's recommendations for discharge plan and follow up therapies     Assistance Recommended at Discharge Frequent or constant Supervision/Assistance  Patient can return home with the following A little help with walking and/or transfers;A little help with bathing/dressing/bathroom;Assistance with cooking/housework;Help with stairs or ramp for entrance;Assist for transportation   Equipment Recommendations  Rolling walker (2 wheels)    Recommendations for Other Services       Precautions / Restrictions Precautions Precautions: Fall Restrictions Weight Bearing Restrictions: No Other Position/Activity Restrictions: wbat     Mobility  Bed Mobility               General bed mobility comments: Pt up in chair and requests back to same    Transfers Overall transfer level: Needs assistance Equipment used: Rolling walker (2 wheels) Transfers: Sit to/from Stand Sit to Stand: Min guard, From elevated surface           General transfer comment: cues for LE management and use of UEs to self assist    Ambulation/Gait Ambulation/Gait assistance: Min assist, Min guard Gait Distance (Feet): 150 Feet Assistive device: Rolling walker (2 wheels) Gait  Pattern/deviations: Step-to pattern, Step-through pattern, Decreased step length - right, Decreased step length - left, Shuffle, Trunk flexed       General Gait Details: cues for posture, position from RW and initial sequence.  Pt notably unsteady on feet but improved stability with increased distance   Stairs             Wheelchair Mobility    Modified Rankin (Stroke Patients Only)       Balance Overall balance assessment: Needs assistance Sitting-balance support: Feet supported, No upper extremity supported Sitting balance-Leahy Scale: Normal     Standing balance support: During functional activity, No upper extremity supported Standing balance-Leahy Scale: Fair Standing balance comment: Pt able to stand statically without BUE, required BUE support on RW                            Cognition Arousal/Alertness: Awake/alert Behavior During Therapy: WFL for tasks assessed/performed Overall Cognitive Status: Within Functional Limits for tasks assessed                                          Exercises Total Joint Exercises Ankle Circles/Pumps: AROM, Both, 20 reps Quad Sets: AROM, Both, 10 reps, Supine Heel Slides: AAROM, Left, 20 reps, Supine Hip ABduction/ADduction: AAROM, Left, 15 reps, Supine Long Arc Quad: AROM, Left, 10 reps, Seated    General Comments        Pertinent Vitals/Pain Pain Assessment Pain Assessment: 0-10 Pain Score: 2  Pain Location: L hip/thigh Pain  Descriptors / Indicators: Aching, Sore Pain Intervention(s): Limited activity within patient's tolerance, Monitored during session, Premedicated before session, Ice applied    Home Living                          Prior Function            PT Goals (current goals can now be found in the care plan section) Acute Rehab PT Goals Patient Stated Goal: golf, walk without pain PT Goal Formulation: With patient Time For Goal Achievement:  03/16/22 Potential to Achieve Goals: Good Progress towards PT goals: Progressing toward goals    Frequency    7X/week      PT Plan Current plan remains appropriate    Co-evaluation              AM-PAC PT "6 Clicks" Mobility   Outcome Measure  Help needed turning from your back to your side while in a flat bed without using bedrails?: None Help needed moving from lying on your back to sitting on the side of a flat bed without using bedrails?: None Help needed moving to and from a bed to a chair (including a wheelchair)?: A Little Help needed standing up from a chair using your arms (e.g., wheelchair or bedside chair)?: A Little Help needed to walk in hospital room?: A Little Help needed climbing 3-5 steps with a railing? : A Little 6 Click Score: 20    End of Session Equipment Utilized During Treatment: Gait belt Activity Tolerance: Patient tolerated treatment well;No increased pain Patient left: in chair;with call bell/phone within reach;with chair alarm set;with family/visitor present Nurse Communication: Mobility status PT Visit Diagnosis: Pain;Difficulty in walking, not elsewhere classified (R26.2) Pain - Right/Left: Left Pain - part of body: Hip     Time: 4854-6270 PT Time Calculation (min) (ACUTE ONLY): 32 min  Charges:  $Gait Training: 8-22 mins $Therapeutic Exercise: 8-22 mins                     [place] Pager [phone] Office [phone]    Haillie Radu 03/10/2022, 1:12 PM

## 2022-03-10 NOTE — Progress Notes (Signed)
Patient discharged to home w/ family. Given all belongings, instructions, equipment. Verbalized understanding of instructions. Escorted to POV.

## 2022-03-10 NOTE — TOC Transition Note (Signed)
Transition of Care [facility]) - CM/SW Discharge Note   Patient Details  Name: Richard Camacho MRN: [id number] Date of Birth: 03-11-1946  [facility] CM/SW Contact:  Lennart Pall, LCSW Phone Number: 03/10/2022, 1:40 PM   Clinical Narrative:    Confirming that pt has received RW to room via [facility].  Plan for HEP.  No TOC needs.   Final next level of care: Home/Self Care Barriers to Discharge: No Barriers Identified   Patient Goals and CMS Choice Patient states their goals for this hospitalization and ongoing recovery are:: return home      Discharge Placement                       Discharge Plan and Services                DME Arranged: Walker rolling DME Agency: [place]                  Social Determinants of Health (SDOH) Interventions     Readmission Risk Interventions     No data to display

## 2022-03-10 NOTE — Progress Notes (Signed)
   Subjective: 1 Day Post-Op Procedure(s) (LRB): TOTAL HIP ARTHROPLASTY ANTERIOR APPROACH (Left) Patient seen in rounds by Dr. Wynelle Link. Patient is well, and has had no acute complaints or problems. Denies SOB or chest pain. Denies calf pain. Foley cath removed this AM. Patient reports pain as mild. We will continue physical therapy today.   Objective: Vital signs in last 24 hours: Temp:  [97.5 F (36.4 C)-98.5 F (36.9 C)] 97.9 F (36.6 C) (11/16 0538) Pulse Rate:  [65-83] 67 (11/16 0538) Resp:  [14-20] 16 (11/16 0538) BP: (108-155)/(53-89) 120/69 (11/16 0538) SpO2:  [94 %-100 %] 97 % (11/16 0538) Weight:  [95.3 kg] 95.3 kg (11/15 1053)  Intake/Output from previous day:  Intake/Output Summary (Last 24 hours) at 03/10/2022 0722 Last data filed at 03/10/2022 0541 Gross per 24 hour  Intake 2354.56 ml  Output 1900 ml  Net 454.56 ml     Intake/Output this shift: No intake/output data recorded.  Labs: Recent Labs    03/10/22 0314  HGB 12.1*   Recent Labs    03/10/22 0314  WBC 13.6*  RBC 3.52*  HCT 35.1*  PLT 190   Recent Labs    03/10/22 0314  NA 140  K 4.3  CL 108  CO2 24  BUN 18  CREATININE 0.70  GLUCOSE 125*  CALCIUM 9.5   No results for input(s): "LABPT", "INR" in the last 72 hours.  Exam: General - Patient is Alert and Oriented Extremity - Neurologically intact Neurovascular intact Sensation intact distally Dorsiflexion/Plantar flexion intact Dressing - dressing C/D/I Motor Function - intact, moving foot and toes well on exam.  Past Medical History:  Diagnosis Date   Arthritis    Cancer ([place])    numerous skin cancers, Mohs surgery.   Coronary artery disease    High cholesterol    Hx of CABG    Hypertension     Assessment/Plan: 1 Day Post-Op Procedure(s) (LRB): TOTAL HIP ARTHROPLASTY ANTERIOR APPROACH (Left) Principal Problem:   OA (osteoarthritis) of hip Active Problems:   Primary osteoarthritis of left knee  Estimated body mass  index is 29.29 kg/m as calculated from the following:   Height as of this encounter: '5\' 11"'$  (1.803 m).   Weight as of this encounter: 95.3 kg. Advance diet Up with therapy D/C IV fluids  DVT Prophylaxis - Xarelto Weight bearing as tolerated.  Continue physical therapy. Expected discharge home today pending progress with physical therapy and if meeting patient goals. Will do HEP once discharged. Follow-up in clinic in 2 weeks.  The PDMP database was reviewed today prior to any opioid medications being prescribed to this patient.  R. Jaynie Bream, PA-C Orthopedic Surgery [phone] 03/10/2022, 7:22 AM

## 2022-03-10 NOTE — Plan of Care (Signed)
  Problem: Education: Goal: Knowledge of the prescribed therapeutic regimen will improve Outcome: Progressing   Problem: Activity: Goal: Ability to tolerate increased activity will improve Outcome: Progressing   Problem: Pain Management: Goal: Pain level will decrease with appropriate interventions Outcome: Progressing   Problem: Safety: Goal: Ability to remain free from injury will improve Outcome: Progressing

## 2022-03-10 NOTE — Progress Notes (Signed)
Physical Therapy Treatment Patient Details Name: Richard Camacho MRN: [id number] DOB: April 02, 1946 Today's Date: 03/10/2022   History of Present Illness Pt is a 76yo male presenting s/p L-THAAA on 03/09/22. PMH: hx of skin cancer, CAD, CABG, HTN, pacemaker 9/23.    PT Comments    Pt continues motivated and progressing well with mobility.  Pt reviewed written HEP including progression to standing therex, reviewed car transfers, up to ambulate increased distance in hall, and with multiple questions asked and answered.   Recommendations for follow up therapy are one component of a multi-disciplinary discharge planning process, led by the attending physician.  Recommendations may be updated based on patient status, additional functional criteria and insurance authorization.  Follow Up Recommendations  Follow physician's recommendations for discharge plan and follow up therapies     Assistance Recommended at Discharge Frequent or constant Supervision/Assistance  Patient can return home with the following A little help with walking and/or transfers;A little help with bathing/dressing/bathroom;Assistance with cooking/housework;Help with stairs or ramp for entrance;Assist for transportation   Equipment Recommendations  Rolling walker (2 wheels)    Recommendations for Other Services       Precautions / Restrictions Precautions Precautions: Fall Restrictions Weight Bearing Restrictions: No Other Position/Activity Restrictions: wbat     Mobility  Bed Mobility               General bed mobility comments: Pt up in chair and requests back to same    Transfers Overall transfer level: Needs assistance Equipment used: Rolling walker (2 wheels) Transfers: Sit to/from Stand Sit to Stand: Min guard, Supervision           General transfer comment: cues for LE management and use of UEs to self assist    Ambulation/Gait Ambulation/Gait assistance: Min guard, Supervision Gait  Distance (Feet): 180 Feet Assistive device: Rolling walker (2 wheels) Gait Pattern/deviations: Step-to pattern, Step-through pattern, Decreased step length - right, Decreased step length - left, Shuffle, Trunk flexed       General Gait Details: cues for posture, position from RW and initial sequence.  Pt notably steadier than earlier session   Stairs             Wheelchair Mobility    Modified Rankin (Stroke Patients Only)       Balance Overall balance assessment: Needs assistance Sitting-balance support: Feet supported, No upper extremity supported Sitting balance-Leahy Scale: Normal     Standing balance support: During functional activity, No upper extremity supported Standing balance-Leahy Scale: Fair Standing balance comment: Pt able to stand statically without BUE, required BUE support on RW                            Cognition Arousal/Alertness: Awake/alert Behavior During Therapy: WFL for tasks assessed/performed Overall Cognitive Status: Within Functional Limits for tasks assessed                                          Exercises Total Joint Exercises Ankle Circles/Pumps: AROM, Both, 20 reps Quad Sets: AROM, Both, 10 reps, Supine Heel Slides: AAROM, Left, 20 reps, Supine Hip ABduction/ADduction: AAROM, Left, 15 reps, Supine Long Arc Quad: AROM, Left, 10 reps, Seated    General Comments        Pertinent Vitals/Pain Pain Assessment Pain Assessment: 0-10 Pain Score: 6  Pain Location: L hip/thigh with initial  standing Pain Descriptors / Indicators: Aching, Sore, Grimacing, Sharp Pain Intervention(s): Limited activity within patient's tolerance, Monitored during session, Patient requesting pain meds-RN notified, Ice applied    Home Living                          Prior Function            PT Goals (current goals can now be found in the care plan section) Acute Rehab PT Goals Patient Stated Goal: golf,  walk without pain PT Goal Formulation: With patient Time For Goal Achievement: 03/16/22 Potential to Achieve Goals: Good Progress towards PT goals: Progressing toward goals    Frequency    7X/week      PT Plan Current plan remains appropriate    Co-evaluation              AM-PAC PT "6 Clicks" Mobility   Outcome Measure  Help needed turning from your back to your side while in a flat bed without using bedrails?: None Help needed moving from lying on your back to sitting on the side of a flat bed without using bedrails?: None Help needed moving to and from a bed to a chair (including a wheelchair)?: A Little Help needed standing up from a chair using your arms (e.g., wheelchair or bedside chair)?: A Little Help needed to walk in hospital room?: A Little Help needed climbing 3-5 steps with a railing? : A Little 6 Click Score: 20    End of Session Equipment Utilized During Treatment: Gait belt Activity Tolerance: Patient tolerated treatment well;No increased pain Patient left: in chair;with call bell/phone within reach;with chair alarm set;with family/visitor present Nurse Communication: Mobility status PT Visit Diagnosis: Pain;Difficulty in walking, not elsewhere classified (R26.2) Pain - Right/Left: Left Pain - part of body: Hip     Time: 8242-3536 PT Time Calculation (min) (ACUTE ONLY): 30 min  Charges:  $Gait Training: 8-22 mins $Therapeutic Exercise: 8-22 mins $Therapeutic Activity: 8-22 mins                     [place] Pager [phone] Office [phone]    [place] 03/10/2022, 1:16 PM

## 2022-03-11 NOTE — Discharge Summary (Signed)
Physician Discharge Summary   Patient ID: Richard Camacho MRN: [id number] DOB/AGE: 1945-10-13 76 y.o.  Admit date: 03/09/2022 Discharge date: 03/10/2022  Primary Diagnosis: Osteoarthritis, left hip   Admission Diagnoses:  Past Medical History:  Diagnosis Date   Arthritis    Cancer [facility])    numerous skin cancers, Mohs surgery.   Coronary artery disease    High cholesterol    Hx of CABG    Hypertension    Discharge Diagnoses:   Principal Problem:   OA (osteoarthritis) of hip Active Problems:   Primary osteoarthritis of left knee  Estimated body mass index is 29.29 kg/m as calculated from the following:   Height as of this encounter: '5\' 11"'$  (1.803 m).   Weight as of this encounter: 95.3 kg.  Procedure:  Procedure(s) (LRB): TOTAL HIP ARTHROPLASTY ANTERIOR APPROACH (Left)   Consults: None  HPI: Richard Camacho is a 76 y.o. male who has advanced end-stage arthritis of their Left  hip with progressively worsening pain and dysfunction.The patient has failed nonoperative management and presents for total hip arthroplasty.   Laboratory Data: Admission on 03/09/2022, Discharged on 03/10/2022  Component Date Value Ref Range Status   ABO/RH(D) 03/09/2022    Final                   Value:O POS Performed at [facility] [address], [place], [place] [address]    WBC 03/10/2022 13.6 (H)  4.0 - 10.5 K/uL Final   RBC 03/10/2022 3.52 (L)  4.22 - 5.81 MIL/uL Final   Hemoglobin 03/10/2022 12.1 (L)  13.0 - 17.0 g/dL Final   HCT 03/10/2022 35.1 (L)  39.0 - 52.0 % Final   MCV 03/10/2022 99.7  80.0 - 100.0 fL Final   MCH 03/10/2022 34.4 (H)  26.0 - 34.0 pg Final   MCHC 03/10/2022 34.5  30.0 - 36.0 g/dL Final   RDW 03/10/2022 12.2  11.5 - 15.5 % Final   Platelets 03/10/2022 190  150 - 400 K/uL Final   nRBC 03/10/2022 0.0  0.0 - 0.2 % Final   Performed at [facility], [place] [address], [place], [place] [address]   Sodium 03/10/2022 140  135 - 145  mmol/L Final   Potassium 03/10/2022 4.3  3.5 - 5.1 mmol/L Final   Chloride 03/10/2022 108  98 - 111 mmol/L Final   CO2 03/10/2022 24  22 - 32 mmol/L Final   Glucose, Bld 03/10/2022 125 (H)  70 - 99 mg/dL Final   Glucose reference range applies only to samples taken after fasting for at least 8 hours.   BUN 03/10/2022 18  8 - 23 mg/dL Final   Creatinine, Ser 03/10/2022 0.70  0.61 - 1.24 mg/dL Final   Calcium 03/10/2022 9.5  8.9 - 10.3 mg/dL Final   GFR, Estimated 03/10/2022 >60  >60 mL/min Final   Comment: (NOTE) Calculated using the CKD-EPI Creatinine Equation (2021)    Anion gap 03/10/2022 8  5 - 15 Final   Performed at [facility], [place] [address], [place], [place] [address]  Hospital Outpatient Visit on 02/28/2022  Component Date Value Ref Range Status   MRSA, PCR 02/28/2022 NEGATIVE  NEGATIVE Final   Staphylococcus aureus 02/28/2022 POSITIVE (A)  NEGATIVE Final   Comment: (NOTE) The Xpert SA Assay (FDA approved for NASAL specimens in patients 44 years of age and older), is one component of a comprehensive surveillance program. It is not intended to diagnose infection nor  to guide or monitor treatment. Performed at [facility], [place] [address], [place], [place] [address]    Sodium 02/28/2022 139  135 - 145 mmol/L Final   Potassium 02/28/2022 4.4  3.5 - 5.1 mmol/L Final   Chloride 02/28/2022 105  98 - 111 mmol/L Final   CO2 02/28/2022 25  22 - 32 mmol/L Final   Glucose, Bld 02/28/2022 108 (H)  70 - 99 mg/dL Final   Glucose reference range applies only to samples taken after fasting for at least 8 hours.   BUN 02/28/2022 17  8 - 23 mg/dL Final   Creatinine, Ser 02/28/2022 0.69  0.61 - 1.24 mg/dL Final   Calcium 02/28/2022 9.9  8.9 - 10.3 mg/dL Final   GFR, Estimated 02/28/2022 >60  >60 mL/min Final   Comment: (NOTE) Calculated using the CKD-EPI Creatinine Equation (2021)    Anion gap 02/28/2022 9  5 - 15 Final   Performed at [facility], [place] [address], [place], [place] [address]   WBC 02/28/2022 6.1  4.0 - 10.5 K/uL Final   RBC 02/28/2022 4.00 (L)  4.22 - 5.81 MIL/uL Final   Hemoglobin 02/28/2022 13.3  13.0 - 17.0 g/dL Final   HCT 02/28/2022 40.5  39.0 - 52.0 % Final   MCV 02/28/2022 101.3 (H)  80.0 - 100.0 fL Final   MCH 02/28/2022 33.3  26.0 - 34.0 pg Final   MCHC 02/28/2022 32.8  30.0 - 36.0 g/dL Final   RDW 02/28/2022 12.1  11.5 - 15.5 % Final   Platelets 02/28/2022 215  150 - 400 K/uL Final   nRBC 02/28/2022 0.0  0.0 - 0.2 % Final   Performed at [facility], [place] [address], [place], [place] [address]   ABO/RH(D) 02/28/2022 O POS   Final   Antibody Screen 02/28/2022 NEG   Final   Sample Expiration 02/28/2022 03/12/2022,2359   Final   Extend sample reason 02/28/2022    Final                   Value:NO TRANSFUSIONS OR PREGNANCY IN THE PAST 3 MONTHS Performed at [facility] [address], [place], [place] [address]      X-Rays:DG Pelvis Portable  Result Date: 03/09/2022 CLINICAL DATA:  Status post left hip arthroplasty EXAM: PORTABLE PELVIS 1-2 VIEWS COMPARISON:  None Available. FINDINGS: There is left hip arthroplasty. No fracture is seen. There are pockets of air in the soft tissues from recent surgery. IMPRESSION: Status post left hip arthroplasty. Electronically Signed   By: Elmer Picker M.D.   On: 03/09/2022 15:43   DG HIP UNILAT WITH PELVIS 2-3 VIEWS LEFT  Result Date: 03/09/2022 CLINICAL DATA:  Left hip surgery. EXAM: DG HIP (WITH OR WITHOUT PELVIS) 2-3V LEFT COMPARISON:  None Available. FINDINGS: C-arm fluoroscopy was provided in the operating room without the presence of a radiologist.11 seconds fluoroscopy time. 1.675 mGy air kerma. Two C-arm fluoroscopic images were obtained intraoperatively and are submitted for post operative interpretation. These demonstrate evidence of recent left total hip arthroplasty. The hardware appears well  positioned. Surgical drain overlies the greater trochanter. Please see intraoperative findings for further detail. IMPRESSION: Intraoperative fluoroscopic guidance for left total hip arthroplasty. Electronically Signed   By: Richardean Sale M.D.   On: 03/09/2022 14:33   DG C-Arm 1-60 Min-No Report  Result Date: 03/09/2022 Fluoroscopy was utilized by the requesting physician.  No radiographic interpretation.   DG C-Arm 1-60 Min-No Report  Result Date: 03/09/2022 Fluoroscopy was utilized by the requesting physician.  No radiographic interpretation.    EKG: Orders placed or performed during the hospital encounter of 02/28/22   EKG 12 lead per protocol   EKG 12 lead per protocol     Hospital Course: Richard Camacho is a 76 y.o. who was admitted to [facility]. They were brought to the operating room on 03/09/2022 and underwent Procedure(s): Davis.  Patient tolerated the procedure well and was later transferred to the recovery room and then to the orthopaedic floor for postoperative care. They were given PO and IV analgesics for pain control following their surgery. They were given 24 hours of postoperative antibiotics of  Anti-infectives (From admission, onward)    Start     Dose/Rate Route Frequency Ordered Stop   03/09/22 1830  ceFAZolin (ANCEF) IVPB 2g/100 mL premix        2 g 200 mL/hr over 30 Minutes Intravenous Every 6 hours 03/09/22 1532 03/10/22 0005   03/09/22 1030  ceFAZolin (ANCEF) IVPB 2g/100 mL premix        2 g 200 mL/hr over 30 Minutes Intravenous On call to O.R. 03/09/22 1026 03/09/22 1252      and started on DVT prophylaxis in the form of Xarelto.   PT and OT were ordered for total joint protocol. Discharge planning consulted to help with postop disposition and equipment needs.  Patient had a good night on the evening of surgery. They started to get up OOB with therapy on POD #0. Pt was seen during rounds and was ready to go home  pending progress with therapy. He worked with therapy on POD #1 and was meeting his goals. Pt was discharged to home later that day in stable condition.  Diet: Cardiac diet Activity: WBAT Follow-up: in 2 weeks Disposition: Home Discharged Condition: stable   Discharge Instructions     Call MD / Call 911   Complete by: As directed    If you experience chest pain or shortness of breath, CALL 911 and be transported to the hospital emergency room.  If you develope a fever above 101 F, pus (white drainage) or increased drainage or redness at the wound, or calf pain, call your surgeon's office.   Change dressing   Complete by: As directed    You have an adhesive waterproof bandage over the incision. Leave this in place until your first follow-up appointment. Once you remove this you will not need to place another bandage.   Constipation Prevention   Complete by: As directed    Drink plenty of fluids.  Prune juice may be helpful.  You may use a stool softener, such as Colace (over the counter) 100 mg twice a day.  Use MiraLax (over the counter) for constipation as needed.   Diet - low sodium heart healthy   Complete by: As directed    Do not sit on low chairs, stoools or toilet seats, as it may be difficult to get up from low surfaces   Complete by: As directed    Driving restrictions   Complete by: As directed    No driving for two weeks   Post-operative opioid taper instructions:   Complete by: As directed    POST-OPERATIVE OPIOID TAPER INSTRUCTIONS: It is important to wean off of your opioid medication as soon as possible. If you do not need pain medication after your surgery it is ok to stop day one. Opioids include: Codeine,  Hydrocodone(Norco, Vicodin), Oxycodone(Percocet, oxycontin) and hydromorphone amongst others.  Long term and even short term use of opiods can cause: Increased pain response Dependence Constipation Depression Respiratory depression And more.  Withdrawal  symptoms can include Flu like symptoms Nausea, vomiting And more Techniques to manage these symptoms Hydrate well Eat regular healthy meals Stay active Use relaxation techniques(deep breathing, meditating, yoga) Do Not substitute Alcohol to help with tapering If you have been on opioids for less than two weeks and do not have pain than it is ok to stop all together.  Plan to wean off of opioids This plan should start within one week post op of your joint replacement. Maintain the same interval or time between taking each dose and first decrease the dose.  Cut the total daily intake of opioids by one tablet each day Next start to increase the time between doses. The last dose that should be eliminated is the evening dose.      TED hose   Complete by: As directed    Use stockings (TED hose) for three weeks on both leg(s).  You may remove them at night for sleeping.   Weight bearing as tolerated   Complete by: As directed       Allergies as of 03/10/2022       Reactions   Hornet Venom Anaphylaxis   Altace [ramipril] Other (See Comments), Cough   "Cough and other"   Latex Rash        Medication List     STOP taking these medications    meloxicam 15 MG tablet Commonly known as: MOBIC       TAKE these medications    APPLE CIDER VINEGAR PO Take 1 Dose by mouth See admin instructions. Take dose mixed with Sinus Breakup Liquid   HOMEOPATHIC PRODUCTS PO Take 1 Dose by mouth daily. Sinus Breakup Liquid for Sinus/Immune Function (Mix with apple cider Vinegar)   HYDROcodone-acetaminophen 5-325 MG tablet Commonly known as: NORCO/VICODIN Take 1-2 tablets by mouth every 6 (six) hours as needed for severe pain.   K2 PLUS D3 PO Take 1 tablet by mouth in the morning.   MAGNESIUM PO Take 2 capsules by mouth in the morning and at bedtime. [employer] Magnesium L-Threonate (Magtein)   methocarbamol 500 MG tablet Commonly known as: ROBAXIN Take 1 tablet (500 mg  total) by mouth every 6 (six) hours as needed for muscle spasms.   NATTOKINASE PO Take 2 capsules by mouth in the morning and at bedtime.   niacinamide 500 MG tablet Take 500 mg by mouth in the morning.   OVER THE COUNTER MEDICATION Take 1 drop by mouth in the morning. Cortexi Drops   rivaroxaban 10 MG Tabs tablet Commonly known as: XARELTO Take 1 tablet (10 mg total) by mouth daily with breakfast for 20 days. Then take one 81 mg aspirin once a day for three weeks. Then discontinue aspirin.   Saccharomyces boulardii 250 MG Pack Take 250 mg by mouth in the morning.   tamsulosin 0.4 MG Caps capsule Commonly known as: FLOMAX Take 0.4 mg by mouth daily.   traMADol 50 MG tablet Commonly known as: ULTRAM Take 1-2 tablets (50-100 mg total) by mouth every 6 (six) hours as needed for moderate pain. What changed:  how much to take reasons to take this   valsartan 80 MG tablet Commonly known as: DIOVAN Take 80 mg by mouth in the morning.   zinc gluconate 50 MG tablet Take 50 mg by mouth daily.  Discharge Care Instructions  (From admission, onward)           Start     Ordered   03/10/22 0000  Weight bearing as tolerated        03/10/22 0726   03/10/22 0000  Change dressing       Comments: You have an adhesive waterproof bandage over the incision. Leave this in place until your first follow-up appointment. Once you remove this you will not need to place another bandage.   03/10/22 0726            Follow-up Information     Gaynelle Arabian, MD. Schedule an appointment as soon as possible for a visit in 2 week(s).   Specialty: Orthopedic Surgery Contact information: [address] [place] [address] [phone]                 Signed: R. Jaynie Bream, PA-C Orthopedic Surgery 03/11/2022, 3:19 PM

## 2023-01-24 ENCOUNTER — Encounter (HOSPITAL_COMMUNITY): Payer: Self-pay | Admitting: Emergency Medicine

## 2023-01-24 ENCOUNTER — Inpatient Hospital Stay (HOSPITAL_COMMUNITY)
Admission: EM | Admit: 2023-01-24 | Discharge: 2023-01-26 | DRG: 069 | Disposition: A | Discharge status: Home / Self Care | Payer: No Typology Code available for payment source | Attending: Internal Medicine | Admitting: Internal Medicine

## 2023-01-24 ENCOUNTER — Emergency Department (HOSPITAL_COMMUNITY): Payer: No Typology Code available for payment source

## 2023-01-24 DIAGNOSIS — Z96642 Presence of left artificial hip joint: Secondary | ICD-10-CM | POA: Diagnosis present

## 2023-01-24 DIAGNOSIS — Z8052 Family history of malignant neoplasm of bladder: Secondary | ICD-10-CM

## 2023-01-24 DIAGNOSIS — Z85828 Personal history of other malignant neoplasm of skin: Secondary | ICD-10-CM | POA: Diagnosis not present

## 2023-01-24 DIAGNOSIS — Z9012 Acquired absence of left breast and nipple: Secondary | ICD-10-CM

## 2023-01-24 DIAGNOSIS — Z79899 Other long term (current) drug therapy: Secondary | ICD-10-CM | POA: Diagnosis not present

## 2023-01-24 DIAGNOSIS — Z888 Allergy status to other drugs, medicaments and biological substances status: Secondary | ICD-10-CM | POA: Diagnosis not present

## 2023-01-24 DIAGNOSIS — Z7902 Long term (current) use of antithrombotics/antiplatelets: Secondary | ICD-10-CM

## 2023-01-24 DIAGNOSIS — Z91030 Bee allergy status: Secondary | ICD-10-CM | POA: Diagnosis not present

## 2023-01-24 DIAGNOSIS — Z950 Presence of cardiac pacemaker: Secondary | ICD-10-CM

## 2023-01-24 DIAGNOSIS — Z7982 Long term (current) use of aspirin: Secondary | ICD-10-CM

## 2023-01-24 DIAGNOSIS — E7849 Other hyperlipidemia: Secondary | ICD-10-CM

## 2023-01-24 DIAGNOSIS — G4733 Obstructive sleep apnea (adult) (pediatric): Secondary | ICD-10-CM

## 2023-01-24 DIAGNOSIS — Z951 Presence of aortocoronary bypass graft: Secondary | ICD-10-CM | POA: Diagnosis not present

## 2023-01-24 DIAGNOSIS — Z91040 Latex allergy status: Secondary | ICD-10-CM | POA: Diagnosis not present

## 2023-01-24 DIAGNOSIS — G459 Transient cerebral ischemic attack, unspecified: Secondary | ICD-10-CM | POA: Diagnosis present

## 2023-01-24 DIAGNOSIS — I255 Ischemic cardiomyopathy: Secondary | ICD-10-CM | POA: Diagnosis present

## 2023-01-24 DIAGNOSIS — I2510 Atherosclerotic heart disease of native coronary artery without angina pectoris: Secondary | ICD-10-CM | POA: Diagnosis present

## 2023-01-24 DIAGNOSIS — Z8679 Personal history of other diseases of the circulatory system: Secondary | ICD-10-CM | POA: Diagnosis not present

## 2023-01-24 DIAGNOSIS — I951 Orthostatic hypotension: Secondary | ICD-10-CM | POA: Diagnosis present

## 2023-01-24 DIAGNOSIS — I10 Essential (primary) hypertension: Secondary | ICD-10-CM | POA: Diagnosis present

## 2023-01-24 DIAGNOSIS — Z8249 Family history of ischemic heart disease and other diseases of the circulatory system: Secondary | ICD-10-CM | POA: Diagnosis not present

## 2023-01-24 DIAGNOSIS — E7800 Pure hypercholesterolemia, unspecified: Secondary | ICD-10-CM | POA: Diagnosis present

## 2023-01-24 DIAGNOSIS — I679 Cerebrovascular disease, unspecified: Secondary | ICD-10-CM

## 2023-01-24 DIAGNOSIS — E785 Hyperlipidemia, unspecified: Secondary | ICD-10-CM | POA: Diagnosis present

## 2023-01-24 DIAGNOSIS — E782 Mixed hyperlipidemia: Secondary | ICD-10-CM | POA: Diagnosis present

## 2023-01-24 LAB — CBC
HCT: 40.9 % (ref 39.0–52.0)
Hemoglobin: 13.9 g/dL (ref 13.0–17.0)
MCH: 34.7 pg — ABNORMAL HIGH (ref 26.0–34.0)
MCHC: 34 g/dL (ref 30.0–36.0)
MCV: 102 fL — ABNORMAL HIGH (ref 80.0–100.0)
Platelets: 247 10*3/uL (ref 150–400)
RBC: 4.01 MIL/uL — ABNORMAL LOW (ref 4.22–5.81)
RDW: 12.3 % (ref 11.5–15.5)
WBC: 7.3 10*3/uL (ref 4.0–10.5)
nRBC: 0 % (ref 0.0–0.2)

## 2023-01-24 LAB — PROTIME-INR
INR: 1 (ref 0.8–1.2)
Prothrombin Time: 13 s (ref 11.4–15.2)

## 2023-01-24 LAB — COMPREHENSIVE METABOLIC PANEL
ALT: 24 U/L (ref 0–44)
AST: 30 U/L (ref 15–41)
Albumin: 4.3 g/dL (ref 3.5–5.0)
Alkaline Phosphatase: 68 U/L (ref 38–126)
Anion gap: 8 (ref 5–15)
BUN: 19 mg/dL (ref 8–23)
CO2: 25 mmol/L (ref 22–32)
Calcium: 9.8 mg/dL (ref 8.9–10.3)
Chloride: 102 mmol/L (ref 98–111)
Creatinine, Ser: 0.92 mg/dL (ref 0.61–1.24)
GFR, Estimated: >60 mL/min (ref >60)
Glucose, Bld: 131 mg/dL — ABNORMAL HIGH (ref 70–99)
Potassium: 3.6 mmol/L (ref 3.5–5.1)
Sodium: 135 mmol/L (ref 135–145)
Total Bilirubin: 0.7 mg/dL (ref 0.3–1.2)
Total Protein: 7.4 g/dL (ref 6.5–8.1)

## 2023-01-24 LAB — APTT: aPTT: 31 s (ref 24–36)

## 2023-01-24 LAB — DIFFERENTIAL
Abs Immature Granulocytes: 0.02 10*3/uL (ref 0.00–0.07)
Basophils Absolute: 0 10*3/uL (ref 0.0–0.1)
Basophils Relative: 1 %
Eosinophils Absolute: 0.6 10*3/uL — ABNORMAL HIGH (ref 0.0–0.5)
Eosinophils Relative: 8 %
Immature Granulocytes: 0 %
Lymphocytes Relative: 34 %
Lymphs Abs: 2.5 10*3/uL (ref 0.7–4.0)
Monocytes Absolute: 0.6 10*3/uL (ref 0.1–1.0)
Monocytes Relative: 8 %
Neutro Abs: 3.5 10*3/uL (ref 1.7–7.7)
Neutrophils Relative %: 49 %

## 2023-01-24 LAB — CBG MONITORING, ED: Glucose-Capillary: 133 mg/dL — ABNORMAL HIGH (ref 70–99)

## 2023-01-24 LAB — I-STAT CHEM 8, ED
BUN: 19 mg/dL (ref 8–23)
Calcium, Ion: 1.27 mmol/L (ref 1.15–1.40)
Chloride: 104 mmol/L (ref 98–111)
Creatinine, Ser: 1 mg/dL (ref 0.61–1.24)
Glucose, Bld: 126 mg/dL — ABNORMAL HIGH (ref 70–99)
HCT: 42 % (ref 39.0–52.0)
Hemoglobin: 14.3 g/dL (ref 13.0–17.0)
Potassium: 3.7 mmol/L (ref 3.5–5.1)
Sodium: 140 mmol/L (ref 135–145)
TCO2: 26 mmol/L (ref 22–32)

## 2023-01-24 MED ORDER — ASPIRIN 81 MG PO TBEC
81.0000 mg | DELAYED_RELEASE_TABLET | Freq: Every day | ORAL | Status: DC
  Administered 2023-01-25 – 2023-01-26 (×2): 81 mg via ORAL
  Filled 2023-01-24 (×2): qty 1

## 2023-01-24 MED ORDER — ASPIRIN 325 MG PO TABS
325.0000 mg | ORAL_TABLET | Freq: Every day | ORAL | Status: DC
  Administered 2023-01-24: 325 mg via ORAL
  Filled 2023-01-24: qty 1

## 2023-01-24 MED ORDER — SODIUM CHLORIDE 0.9% FLUSH
3.0000 mL | Freq: Once | INTRAVENOUS | Status: AC
  Administered 2023-01-24: 3 mL via INTRAVENOUS

## 2023-01-24 MED ORDER — CLOPIDOGREL BISULFATE 300 MG PO TABS
300.0000 mg | ORAL_TABLET | Freq: Once | ORAL | Status: AC
  Administered 2023-01-24: 300 mg via ORAL
  Filled 2023-01-24: qty 1

## 2023-01-24 MED ORDER — LABETALOL HCL 200 MG PO TABS: 100.0000 mg | ORAL_TABLET | Freq: Three times a day (TID) | ORAL | Status: DC | PRN

## 2023-01-24 MED ORDER — CLOPIDOGREL BISULFATE 75 MG PO TABS
75.0000 mg | ORAL_TABLET | Freq: Every day | ORAL | Status: DC
  Administered 2023-01-25 – 2023-01-26 (×2): 75 mg via ORAL
  Filled 2023-01-24 (×3): qty 1

## 2023-01-24 NOTE — ED Notes (Signed)
EDP at bedside to assess.   

## 2023-01-24 NOTE — Consult Note (Signed)
[facility] Tele[facility]urology Consult Services   Patient Name:   Richard Camacho, Richard Camacho Date of Birth:   11-10-1945 Identification Number:   MRN - [id number] Date of Service:   01/24/2023 21:56:59  Diagnosis:       G45.9 - Transient cerebral ischemic attack, unspecified  Impression:      77yo man with history of HTN, HLD, CAD s/p pacer presents with dizziness and slurred speech. Symptoms entirely resolved at the time of my exam thus no indication for thrombolytics presently. Symptoms may represent high risk TIA. Recommend admission for TIA work up with MRI Brain w/o, MRA head/neck, check 2d echo, start aspirin and plavix for now, permissive HTN to <220/120 overnight.  Our recommendations are outlined below.  Recommendations:        Stroke/Telemetry Floor       Neuro Checks       Bedside Swallow Eval       DVT Prophylaxis       IV Fluids, Normal Saline       Head of Bed 30 Degrees       Euglycemia and Avoid Hyperthermia (PRN Acetaminophen)       Bolus with Clopidogrel 300 mg bolus x1 and initiate dual antiplatelet therapy with Aspirin 81 mg daily and Clopidogrel 75 mg daily       Antihypertensives PRN if Blood pressure is greater than 220/120 or there is a concern for End organ damage/contraindications for permissive HTN. If blood pressure is greater than 220/120 give labetalol PO or IV or Vasotec IV with a goal of 15% reduction in BP during the first 24 hours.  Sign Out:       Discussed with Emergency Department Provider    ------------------------------------------------------------------------------  Advanced Imaging: Advanced Imaging Deferred because:  symptoms resolved at the time of my exam   Metrics: Last Known Well: 01/24/2023 20:30:00 [facility] Notification Time: 01/24/2023 21:56:59 Arrival Time: 01/24/2023 21:22:00 Stamp Time: 01/24/2023 21:56:59 Initial Response Time: 01/24/2023 22:05:55 Symptoms: slurred speech, dizziness. Initial patient interaction:  01/24/2023 22:07:20 NIHSS Assessment Completed: 01/24/2023 22:14:28 Patient is not a candidate for Thrombolytic. Thrombolytic Medical Decision: 01/24/2023 22:14:28 Patient was not deemed candidate for Thrombolytic because of following reasons: Resolved symptoms .  CT head showed no acute hemorrhage or acute core infarct.  Primary Provider Notified of Diagnostic Impression and Management Plan on: 01/24/2023 22:20:06    ------------------------------------------------------------------------------  History of Present Illness: Patient is a 77 year old Male.  Patient was brought by private transportation with symptoms of slurred speech, dizziness. 77yo man with history of HTN, HLD, CAD s/p pacer presents with dizziness and slurred speech. He felt fine up until getting up out of his chair at 830pm tonight when he was sitting just after dinner. He struggled to get up, noted unsteady gait walking back into the kitchen with some dizziness, then family noted slurred speech. They took him to the ED for evaluation. By the time he reached the ED symptoms had mostly resolved other than mild slurred speech. By the time of my evaluation patient and family at bedside felt speech was entirely back to normal. Not presently on antiplatelet therapy or anticoagulants. No prior history of stroke.   Past Medical History:      Hypertension      Hyperlipidemia      Coronary Artery Disease  Medications:  No Anticoagulant use  No Antiplatelet use Reviewed EMR for current medications  Allergies:  Reviewed  Social History: Drug Use: No  Family History:  There is no family history  of premature cerebrovascular disease pertinent to this consultation  ROS : 14 Points Review of Systems was performed and was negative except mentioned in HPI.  Past Surgical History: There Is No Surgical History Contributory To Today's Visit    Examination: BP(129/70), Pulse(61), 1A: Level of Consciousness - Alert;  keenly responsive + 0 1B: Ask Month and Age - Both Questions Right + 0 1C: Blink Eyes & Squeeze Hands - Performs Both Tasks + 0 2: Test Horizontal Extraocular Movements - Normal + 0 3: Test Visual Fields - No Visual Loss + 0 4: Test Facial Palsy (Use Grimace if Obtunded) - Normal symmetry + 0 5A: Test Left Arm Motor Drift - No Drift for 10 Seconds + 0 5B: Test Right Arm Motor Drift - No Drift for 10 Seconds + 0 6A: Test Left Leg Motor Drift - No Drift for 5 Seconds + 0 6B: Test Right Leg Motor Drift - No Drift for 5 Seconds + 0 7: Test Limb Ataxia (FNF/Heel-Shin) - No Ataxia + 0 8: Test Sensation - Normal; No sensory loss + 0 9: Test Language/Aphasia - Normal; No aphasia + 0 10: Test Dysarthria - Normal + 0 11: Test Extinction/Inattention - No abnormality + 0  NIHSS Score: 0  NIHSS Free Text : speech clear, family at bedside reported patient's speech was back to normal, grip strength equal and strong per RN, gait is normal and symmetric with stable turns. No paraphasic errrors.  Pre-Morbid Modified Rankin Scale: 0 Points = No symptoms at all  Spoke with : Dr. Renaye Rakers, EDMD  This consult was conducted in real time using interactive audio and Immunologist. Patient was informed of the technology being used for this visit and agreed to proceed. Patient located in hospital and provider located at home/office setting.   Patient is being evaluated for possible acute neurologic impairment and high probability of imminent or life-threatening deterioration. I spent total of 30 minutes providing care to this patient, including time for face to face visit via telemedicine, review of medical records, imaging studies and discussion of findings with providers, the patient and/or family.   Dr Idelle Jo   [facility] For Inpatient follow-up with [facility] physician please call [facility] [phone]. This is not an outpatient service. Post hospital discharge, please contact hospital  directly.  Please do not communicate with [facility] physicians via secure chat. If you have any questions, Please contact [facility]. Please call or reconsult our service if there are any clinical or diagnostic changes.

## 2023-01-24 NOTE — ED Triage Notes (Addendum)
Pt reports LSN 8:30pm. Then got up and walked into kitchen. Was having hard time making sense of what he was doing and wife noticed slurring speech. Wife reports his slurred speech has improved from earlier. No deficits possibly weaker on left side with grip. 1 alcoholic drink at dinner at 6:30. Denies headache. Does not take blood thinner. HTN- valsartin, Pacemaker, metoprolol. Not diabetic, nonsmoker.  Friend witnessed- states wobbly on feet, seemed confused, slurred speech.  NIH 1 for dysarthria at this time.

## 2023-01-24 NOTE — ED Provider Notes (Signed)
[place] [facility] AT [facility] Provider Note   CSN: [id number] Arrival date & time: 01/24/23  2122     History  Chief Complaint  Patient presents with   Stroke Symptoms    Richard Camacho is a 77 y.o. male presenting to the ED with complaint for slurred speech and confusion.  Patient has a history of hypertension, pacemaker.  He has never had stroke or TIA.  He was at dinner with his male partner this evening, reports he had 1 alcoholic drink around 6 PM, and then around 8:30 PM was noted to have slurred speech, seemed to be confused, and difficulty with walking, by the male partner.  Since coming to the ED the symptoms have largely improved although speech remains very mildly slurred.  Patient denies headache.  Denies numbness or weakness of the arms or the legs.  Denies blood thinner or anticoagulation use  HPI     Home Medications Prior to Admission medications   Medication Sig Start Date End Date Taking? Authorizing Provider  APPLE CIDER VINEGAR PO Take 1 Dose by mouth See admin instructions. Take dose mixed with Sinus Breakup Liquid    [provider]  HOMEOPATHIC PRODUCTS PO Take 1 Dose by mouth daily. Sinus Breakup Liquid for Sinus/Immune Function (Mix with apple cider Vinegar)    [provider]  HYDROcodone-acetaminophen (NORCO/VICODIN) 5-325 MG tablet Take 1-2 tablets by mouth every 6 (six) hours as needed for severe pain. 03/10/22   Eartha Inch, PA  MAGNESIUM PO Take 2 capsules by mouth in the morning and at bedtime. [employer] Magnesium L-Threonate (Magtein)    [provider]  methocarbamol (ROBAXIN) 500 MG tablet Take 1 tablet (500 mg total) by mouth every 6 (six) hours as needed for muscle spasms. 03/10/22   Eartha Inch, PA  NATTOKINASE PO Take 2 capsules by mouth in the morning and at bedtime.    [provider]  niacinamide 500 MG tablet Take 500 mg by mouth in the morning.     [provider]  OVER THE COUNTER MEDICATION Take 1 drop by mouth in the morning. Cortexi Drops    [provider]  Saccharomyces boulardii 250 MG PACK Take 250 mg by mouth in the morning.    [provider]  tamsulosin (FLOMAX) 0.4 MG CAPS capsule Take 0.4 mg by mouth daily.    [provider]  traMADol (ULTRAM) 50 MG tablet Take 1-2 tablets (50-100 mg total) by mouth every 6 (six) hours as needed for moderate pain. 03/10/22   Eartha Inch, PA  valsartan (DIOVAN) 80 MG tablet Take 80 mg by mouth in the morning.    [provider]  Vitamin D-Vitamin K (K2 PLUS D3 PO) Take 1 tablet by mouth in the morning.    [provider]  zinc gluconate 50 MG tablet Take 50 mg by mouth daily.    [provider]      Allergies    Hornet venom, Altace [ramipril], and Latex    Review of Systems   Review of Systems  Physical Exam Updated Vital Signs BP 116/76 (BP Location: Right Arm)   Pulse 60   Temp 98.2 F (36.8 C) (Oral)   Resp 16   SpO2 96%  Physical Exam Constitutional:      General: He is not in acute distress. HENT:     Head: Normocephalic and atraumatic.  Eyes:     Conjunctiva/sclera: Conjunctivae normal.     Pupils: Pupils  are equal, round, and reactive to light.  Cardiovascular:     Rate and Rhythm: Normal rate and regular rhythm.  Pulmonary:     Effort: Pulmonary effort is normal. No respiratory distress.  Abdominal:     General: There is no distension.     Tenderness: There is no abdominal tenderness.  Skin:    General: Skin is warm and dry.  Neurological:     Mental Status: He is alert and oriented to person, place, and time. Mental status is at baseline.     Comments: Very mildly slurred speech or dysarthria No other neurological deficits noted on exam  Psychiatric:        Mood and Affect: Mood normal.        Behavior: Behavior normal.     ED Results / Procedures / Treatments   Labs (all labs ordered  are listed, but only abnormal results are displayed) Labs Reviewed  CBC - Abnormal; Notable for the following components:      Result Value   RBC 4.01 (*)    MCV 102.0 (*)    MCH 34.7 (*)    All other components within normal limits  DIFFERENTIAL - Abnormal; Notable for the following components:   Eosinophils Absolute 0.6 (*)    All other components within normal limits  COMPREHENSIVE METABOLIC PANEL - Abnormal; Notable for the following components:   Glucose, Bld 131 (*)    All other components within normal limits  I-STAT CHEM 8, ED - Abnormal; Notable for the following components:   Glucose, Bld 126 (*)    All other components within normal limits  CBG MONITORING, ED - Abnormal; Notable for the following components:   Glucose-Capillary 133 (*)    All other components within normal limits  PROTIME-INR  APTT  ETHANOL  RAPID URINE DRUG SCREEN, HOSP PERFORMED    EKG EKG Interpretation Date/Time:  Tuesday January 24 2023 21:41:47 EDT Ventricular Rate:  62 PR Interval:    QRS Duration:  158 QT Interval:  448 QTC Calculation: 455 R Axis:   48  Text Interpretation: V paced rhythm Confirmed by Alvester Chou [phone]) on 01/24/2023 10:22:55 PM  Radiology CT HEAD CODE STROKE WO CONTRAST  Result Date: 01/24/2023 CLINICAL DATA:  Code stroke.  Slurred speech EXAM: CT HEAD WITHOUT CONTRAST TECHNIQUE: Contiguous axial images were obtained from the base of the skull through the vertex without intravenous contrast. RADIATION DOSE REDUCTION: This exam was performed according to the departmental dose-optimization program which includes automated exposure control, adjustment of the mA and/or kV according to patient size and/or use of iterative reconstruction technique. COMPARISON:  None Available. FINDINGS: Brain: There is no mass, hemorrhage or extra-axial collection. The size and configuration of the ventricles and extra-axial CSF spaces are normal. There is hypoattenuation of the  periventricular white matter, most commonly indicating chronic ischemic microangiopathy. Vascular: Atherosclerotic calcification of the internal carotid arteries at the skull base. No abnormal hyperdensity of the major intracranial arteries or dural venous sinuses. Skull: The visualized skull base, calvarium and extracranial soft tissues are normal. Sinuses/Orbits: No fluid levels or advanced mucosal thickening of the visualized paranasal sinuses. No mastoid or middle ear effusion. The orbits are normal. ASPECTS [facility] Stroke Program Early CT Score) - Ganglionic level infarction (caudate, lentiform nuclei, internal capsule, insula, M1-M3 cortex): 7 - Supraganglionic infarction (M4-M6 cortex): 3 Total score (0-10 with 10 being normal): 10 IMPRESSION: 1. No acute intracranial abnormality. 2. ASPECTS is 10. These results were called by telephone at the  time of interpretation on 01/24/2023 at 9:53 pm to provider Garey Alleva , who verbally acknowledged these results. Electronically Signed   By: Deatra Robinson M.D.   On: 01/24/2023 21:53    Procedures .Critical Care  Performed by: Terald Sleeper, MD Authorized by: Terald Sleeper, MD   Critical care provider statement:    Critical care time (minutes):  30   Critical care time was exclusive of:  Separately billable procedures and treating other patients   Critical care was necessary to treat or prevent imminent or life-threatening deterioration of the following conditions:  CNS failure or compromise   Critical care was time spent personally by me on the following activities:  Ordering and performing treatments and interventions, ordering and review of laboratory studies, ordering and review of radiographic studies, pulse oximetry, review of old charts, examination of patient and evaluation of patient's response to treatment     Medications Ordered in ED Medications  aspirin tablet 325 mg (325 mg Oral Given 01/24/23 2235)  sodium chloride flush (NS)  0.9 % injection 3 mL (3 mLs Intravenous Given 01/24/23 2216)  clopidogrel (PLAVIX) tablet 300 mg (300 mg Oral Given 01/24/23 2235)    ED Course/ Medical Decision Making/ A&P Clinical Course as of 01/24/23 2243  Tue Jan 24, 2023  2220 Spoke to Dr Senaida Ores teleneuro who advises admission for TIA.  At this point the patient stroke scale 0 and appears to be completely back to baseline level according to his family member as well at bedside.  However there is still clinical concern for TIA, therefore the neurologist did recommend initiating aspirin 325 mg and Plavix 300 mg, admission to the hospital for MRI and MRA as well as remaining stroke workup.  The patient is in agreement this plan. [MT]  2243 Admitted to hospitalist [MT]    Clinical Course User Index [MT] Hieu Herms, Kermit Balo, MD                                 Medical Decision Making Amount and/or Complexity of Data Reviewed Labs: ordered. Radiology: ordered.  Risk OTC drugs. Prescription drug management.   Patient is presenting with constellation of slurred or difficult speech, confusion, difficulty with ambulation, largely transient and improved now on arrival.  He does have 1 potential point on NIH stroke scale for mild dysarthria  Stroke comorbidities include high blood pressure, high cholesterol.  Code stroke activated - LWK 8:30 PM witnessed by male partner at bedside  Supplemental hx from male partner  CT imaging, labs ordered, code stroke activated  Labs and imaging personally viewed interpreted.  No emergent findings on CT imaging.  The lab work with no emergent findings.  EKG shows ventricular paced rhythm which is chronic with patient's known pacemaker, no acute ischemic findings.  Patient was reassessed with improvement of his mild dysarthria back to baseline.  Neurology was consulted, please see ED course.  Plan for medical admission at this time for TIA evaluation.  Aspirin and Plavix ordered for stroke and  TIA prophylaxis management, per neurology discussion.  Tnk was considered but ultimately decided not to be indicated or beneficial to the patient given his NIH stroke scale is now 0.        Final Clinical Impression(s) / ED Diagnoses Final diagnoses:  TIA (transient ischemic attack)    Rx / DC Orders ED Discharge Orders     None  Terald Sleeper, MD 01/24/23 [phone]

## 2023-01-24 NOTE — Progress Notes (Signed)
ED RN able to establish 2 sufficient PIV accesses.

## 2023-01-24 NOTE — H&P (Signed)
History and Physical    Richard Camacho GNF:[id number] DOB: [birth year] DOA: 01/24/2023  PCP: [facility]   Patient coming from: Home   Chief Complaint:  Chief Complaint  Patient presents with   Stroke Symptoms    HPI:  Richard Camacho is a 77 y.o. 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  Patient reported that around 8:30 PM tonight 02/02/23 he has noticed sudden onset of slurred speech with associated dizziness and confusion which resolved by itself after few minutes.  Patient was brought to ED for further evaluation. Patient denies any previous history of TIA or stroke.  Currently patient is alert oriented x 4.  Able to answer all questions appropriately.  Denies any headache, blurry vision, hearing problem, chest pain, palpitation, bilateral upper lower extremities weakness, focal weakness, tingling, tremor, dizziness, sensory change, seizure, syncope or presyncope. 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  [facility]  Teleneurology evaluated patient in the ED.  Per neurology Dr. Yolanda Bonine seem persistent with TIA.  Recommended TIA workup with MRI of the brain without contrast, MRA of the head and neck, 2D echo, start aspirin and Plavix and permissive hypertension overnight.  ED Course:  At presentation to ED patient is hemodynamically stable. CBC grossly unremarkable except elevated MCV 102 and MCH 34.  Platelet count 247, WBC 7.3 and RBC 4.01. CMP grossly unremarkable. Pending blood alcohol  level.  Code stroke activated in the ED. CT head no acute intracranial abnormality.  In the ED patient has been given aspirin 325 mg and Plavix 300 mg once.  Spoke with on-call neurology Dr. Wilford Corner recommended to obtain the MRI of the head however we need to figure out if patient's pacemaker is MRI compatible or not.  In case if the patient's pacemaker MRI compatible in that case we need to do CT head.  Hospitalist has been contacted for further evaluation and management of TIA.  Patient has been transferred to [facility] as [facility] has pacemaker specific MRI.   Review of Systems:  Review of Systems  Constitutional:  Negative for chills, fever and malaise/fatigue.  Eyes:  Negative for blurred vision.  Respiratory:  Negative for cough, sputum production and shortness of breath.   Cardiovascular:  Negative for chest pain, palpitations, claudication and leg swelling.  Gastrointestinal:  Negative for heartburn and nausea.  Genitourinary:  Negative for dysuria and urgency.  Musculoskeletal:  Negative for back pain, joint pain, myalgias and neck pain.  Neurological:  Negative for dizziness, tingling, tremors, sensory change, speech change, focal weakness, seizures, loss of consciousness, weakness and headaches.  Psychiatric/Behavioral:  The patient is not nervous/anxious.     Past Medical History:  Diagnosis Date   Arthritis    Cancer [facility])    numerous skin cancers, Mohs surgery.   Coronary artery disease    High cholesterol    Hx of CABG    Hypertension     Past Surgical History:  Procedure Laterality Date   CORONARY ARTERY BYPASS GRAFT  2012   done at [facility]   MODIFIED RADICAL  MASTECTOMY Left    infected breast tissue   MOHS SURGERY     TOTAL HIP ARTHROPLASTY Left 03/09/2022   Procedure: TOTAL HIP ARTHROPLASTY ANTERIOR APPROACH;  Surgeon: Ollen Gross, MD;  Location: [facility]S;  Service: Orthopedics;  Laterality: Left;     reports that he has never smoked. He  has never used smokeless tobacco. He reports current alcohol use. He reports that he does not use drugs.  Allergies  Allergen Reactions   Hornet Venom Anaphylaxis   Altace [Ramipril] Other (See Comments) and Cough    "Cough and other"   Latex Rash    Family History  Problem Relation Age of Onset   CAD Father    Bladder Cancer Father     Prior to Admission medications   Medication Sig Start Date End Date Taking? Authorizing Provider  APPLE CIDER VINEGAR PO Take 1 Dose by mouth See admin instructions. Take dose mixed with Sinus Breakup Liquid    [provider]  HOMEOPATHIC PRODUCTS PO Take 1 Dose by mouth daily. Sinus Breakup Liquid for Sinus/Immune Function (Mix with apple cider Vinegar)    [provider]  HYDROcodone-acetaminophen (NORCO/VICODIN) 5-325 MG tablet Take 1-2 tablets by mouth every 6 (six) hours as needed for severe pain. 03/10/22   Eartha Inch, PA  MAGNESIUM PO Take 2 capsules by mouth in the morning and at bedtime. [employer] Magnesium L-Threonate (Magtein)    [provider]  methocarbamol (ROBAXIN) 500 MG tablet Take 1 tablet (500 mg total) by mouth every 6 (six) hours as needed for muscle spasms. 03/10/22   Eartha Inch, PA  NATTOKINASE PO Take 2 capsules by mouth in the morning and at bedtime.    [provider]  niacinamide 500 MG tablet Take 500 mg by mouth in the morning.    [provider]  OVER THE COUNTER MEDICATION Take 1 drop by mouth in the morning. Cortexi Drops    [provider]  Saccharomyces boulardii 250 MG PACK Take 250 mg by mouth in the morning.    [provider]  tamsulosin (FLOMAX) 0.4 MG CAPS capsule Take 0.4 mg by mouth daily.    [provider]  traMADol (ULTRAM) 50 MG tablet Take 1-2 tablets (50-100 mg total) by mouth every 6 (six) hours as needed for moderate pain. 03/10/22   Eartha Inch, PA  valsartan (DIOVAN) 80 MG tablet Take 80 mg by mouth  in the morning.    [provider]  Vitamin D-Vitamin K (K2 PLUS D3 PO) Take 1 tablet by mouth in the morning.    [provider]  zinc gluconate 50 MG tablet Take 50 mg by mouth daily.    [provider]     Physical Exam: Vitals:   01/25/23 0200 01/25/23 0513 01/25/23 0545 01/25/23 0600  BP: (!) 143/67  (!) 146/72   Pulse: 60  60   Resp: (!) 22  18   Temp:  97.9 F (36.6 C)  98 F (36.7 C)  TempSrc:  Oral  Oral  SpO2: 96%  98%     Physical Exam HENT:     Head: Normocephalic.     Nose: Nose normal.     Mouth/Throat:     Mouth: Mucous membranes are moist.  Eyes:     Conjunctiva/sclera: Conjunctivae normal.  Cardiovascular:     Rate and Rhythm: Normal rate and regular rhythm.     Pulses: Normal pulses.     Heart sounds: Normal heart sounds.  Pulmonary:     Effort: Pulmonary effort is normal.     Breath sounds: Normal breath sounds.  Abdominal:     General: Bowel sounds are normal.  Musculoskeletal:     Cervical back: Neck supple.     Right lower leg: No edema.     Left lower leg: No edema.  Skin:    Capillary Refill: Capillary refill takes less than 2 seconds.  Neurological:     Mental Status: He is oriented to person, place, and time.     Cranial Nerves: No cranial nerve deficit.     Sensory: No sensory deficit.     Motor: No weakness.     Coordination: Coordination normal.     Deep Tendon Reflexes: Reflexes normal.  Psychiatric:        Mood and Affect: Mood normal.        Thought Content: Thought content normal.      Labs on Admission: I have personally reviewed following labs and imaging studies  CBC: Recent Labs  Lab 01/24/23 2140 01/24/23 2147 01/25/23 0355  WBC 7.3  --  6.5  NEUTROABS 3.5  --   --   HGB 13.9 14.3 13.4  HCT 40.9 42.0 39.7  MCV 102.0*  --  101.0*  PLT 247  --  206   Basic Metabolic Panel: Recent Labs  Lab 01/24/23 2140 01/24/23 2147 01/25/23 0355  NA 135 140 139  K 3.6 3.7 3.6  CL 102 104 107   CO2 25  --  23  GLUCOSE 131* 126* 97  BUN 19 19 18   CREATININE 0.92 1.00 0.77  CALCIUM 9.8  --  8.9   GFR: CrCl cannot be calculated (Unknown ideal weight.). Liver Function Tests: Recent Labs  Lab 01/24/23 2140 01/25/23 0355  AST 30 34  ALT 24 17  ALKPHOS 68 57  BILITOT 0.7 1.0  PROT 7.4 6.5  ALBUMIN 4.3 3.8   No results for input(s): "LIPASE", "AMYLASE" in the last 168 hours. No results for input(s): "AMMONIA" in the last 168 hours. Coagulation Profile: Recent Labs  Lab 01/24/23 2140  INR 1.0   Cardiac Enzymes: No results for input(s): "CKTOTAL", "CKMB", "CKMBINDEX", "TROPONINI", "TROPONINIHS" in the last 168 hours. BNP (last 3 results) No results for input(s): "BNP" in the last 8760 hours. HbA1C: No results for input(s): "HGBA1C" in the last 72 hours. CBG: Recent Labs  Lab 01/24/23 2140  GLUCAP 133*   Lipid Profile: Recent Labs    01/25/23 0355  CHOL 204*  HDL 39*  LDLCALC 136*  TRIG 143  CHOLHDL 5.2   Thyroid Function Tests: No results for input(s): "TSH", "T4TOTAL", "FREET4", "T3FREE", "THYROIDAB" in the last 72 hours. Anemia Panel: No results for input(s): "VITAMINB12", "FOLATE", "FERRITIN", "TIBC", "IRON", "RETICCTPCT" in the last 72 hours. Urine analysis:    Component Value Date/Time   COLORURINE YELLOW 07/18/2013 2043   APPEARANCEUR CLEAR 07/18/2013 2043   LABSPEC 1.013 07/18/2013 2043   PHURINE 6.5 07/18/2013 2043   GLUCOSEU NEGATIVE 07/18/2013 2043   HGBUR NEGATIVE 07/18/2013 2043   BILIRUBINUR NEGATIVE 07/18/2013 2043   KETONESUR NEGATIVE 07/18/2013 2043   PROTEINUR NEGATIVE 07/18/2013 2043   UROBILINOGEN 0.2 07/18/2013 2043   NITRITE NEGATIVE 07/18/2013 2043   LEUKOCYTESUR NEGATIVE 07/18/2013 2043    Radiological Exams on Admission: I have personally reviewed images CT HEAD CODE STROKE WO CONTRAST  Result Date: 01/24/2023 CLINICAL DATA:  Code stroke.  Slurred speech EXAM: CT HEAD WITHOUT CONTRAST TECHNIQUE: Contiguous axial  images were obtained  from the base of the skull through the vertex without intravenous contrast. RADIATION DOSE REDUCTION: This exam was performed according to the departmental dose-optimization program which includes automated exposure control, adjustment of the mA and/or kV according to patient size and/or use of iterative reconstruction technique. COMPARISON:  None Available. FINDINGS: Brain: There is no mass, hemorrhage or extra-axial collection. The size and configuration of the ventricles and extra-axial CSF spaces are normal. There is hypoattenuation of the periventricular white matter, most commonly indicating chronic ischemic microangiopathy. Vascular: Atherosclerotic calcification of the internal carotid arteries at the skull base. No abnormal hyperdensity of the major intracranial arteries or dural venous sinuses. Skull: The visualized skull base, calvarium and extracranial soft tissues are normal. Sinuses/Orbits: No fluid levels or advanced mucosal thickening of the visualized paranasal sinuses. No mastoid or middle ear effusion. The orbits are normal. ASPECTS [facility] Stroke Program Early CT Score) - Ganglionic level infarction (caudate, lentiform nuclei, internal capsule, insula, M1-M3 cortex): 7 - Supraganglionic infarction (M4-M6 cortex): 3 Total score (0-10 with 10 being normal): 10 IMPRESSION: 1. No acute intracranial abnormality. 2. ASPECTS is 10. These results were called by telephone at the time of interpretation on 01/24/2023 at 9:53 pm to provider MATTHEW TRIFAN , who verbally acknowledged these results. Electronically Signed   By: Deatra Robinson M.D.   On: 01/24/2023 21:53    EKG: My personal interpretation of EKG shows: EKG showing V paced rhythm heart rate 62.    Assessment/Plan: Active Problems:   TIA (transient ischemic attack)   Essential hypertension   Hyperlipidemia   History of CAD (coronary artery disease)   History of PVC S/P placement of cardiac pacemaker 2023   OSA on  CPAP    Assessment and Plan: Transient ischemic attack Dizziness-resolved Dysarthria-resolved -Patient presenting with complaining of dizziness and slurred speech.  Patient symptoms has been entirely resolved.  Initially teleneurology Dr. Verda Cumins evaluated patient and there is no indication for thrombolysis at this time. Neurology recommended MRI of the brain, MRA of head and neck, 2D echo, start aspirin Plavix. -Spoke with inpatient neurology Dr. Wilford Corner recommended transfer patient to [facility] and admit patient to stroke telemetry unit. -Obtaining MRI of the head and MRI of head and neck. Need to verify if patient's pacemaker is MRI compatible or not. If it is not MRI compatible in that case Dr. Wilford Corner recommended do repeat CT head in the morning. - Continue neurocheck every 4 hours. - Checking bedside swallow screen. - Continue DVT prophylaxis. - Keep head of the bed above 30 degree angle - Maintaining euglycemia and avoid hypoglycemia. -Obtaining echocardiogram bubble study. - Per neurorecommendation giving bolus of Plavix 300 mg x 1 after that will initiate dual antiplatelet aspirin and Plavix daily. -In the ED patient received Plavix 300 mg once.  Continue aspirin 81 mg daily and Plavix 75 mg daily. - Continue to maintain permissive hypertension hypertension for next 24 hours. Continue labetalol 100 mg 3 times daily if systolic blood pressure more than 210 or diastolic blood pressure more than 120. -Consulted inpatient PT and OT for evaluation. -Continue cardiac monitoring -Neurology will continue to follow.  Appreciate neurology input.  History of CAD status post CABG -Holding home Lopressor in the context of TIA as allowing permissive hypertension for next 24 hours. -At home patient was not on aspirin or Plavix neither any lipid-lowering agent.  History of PVCs status post pacemaker -Holding Lopressor for 24 hours permissive hypertension in the setting of TIA. -EKG showing V  paced rhythm  heart rate 62.  There is no ST-T wave abnormality.  Essential hypertension -Holding Diovan and metoprolol in the setting of TIA.  Hyperlipidemia -Not on any lipid-lowering agent at home.  Patient takes apple cider vinegar p.o.  OSA on CPAP -Patient wife reported patient use CPAP at bedtime.   -Continue CPAP nighttime.  DVT prophylaxis:  SCDs.  Heparin 5000 3 times daily. Code Status:  Full Code Diet: Heart healthy diet Family Communication: Updated patient's wife and son at the bedside. Disposition Plan: Pending MRI of the head.  Waiting to MRI tech to find out if pacemaker is MRI compatible or not.  Otherwise  will do repeat CT head. Consults: Neurology Admission status:   Inpatient, Telemetry bed  Severity of Illness: The appropriate patient status for this patient is INPATIENT. Inpatient status is judged to be reasonable and necessary in order to provide the required intensity of service to ensure the patient's safety. The patient's presenting symptoms, physical exam findings, and initial radiographic and laboratory data in the context of their chronic comorbidities is felt to place them at high risk for further clinical deterioration. Furthermore, it is not anticipated that the patient will be medically stable for discharge from the hospital within 2 midnights of admission.   * I certify that at the point of admission it is my clinical judgment that the patient will require inpatient hospital care spanning beyond 2 midnights from the point of admission due to high intensity of service, high risk for further deterioration and high frequency of surveillance required.Marland Kitchen    Tereasa Coop, MD [facility]  How to contact the [facility] Attending or Consulting provider 7A - 7P or covering provider during after hours 7P -7A, for this patient.  Check the care team in [facility] and look for a) attending/consulting TR[facility] provider listed and b) the [facility] team listed Log into [email or web address]  and use [place]'s universal password to access. If you do not have the password, please contact the hospital operator. Locate the [facility] provider you are looking for under [facility] and page to a number that you can be directly reached. If you still have difficulty reaching the provider, please page the [facility] (Director on Call) for the Hospitalists listed on amion for assistance.  01/25/2023, 7:09 AM

## 2023-01-24 NOTE — ED Notes (Signed)
Reported code stroke to emergency line at 21:35

## 2023-01-24 NOTE — ED Notes (Signed)
Pt ambulated to the bathroom w/out any difficulties.

## 2023-01-24 NOTE — Progress Notes (Signed)
2122 - patient arrived to ED via POV due to slurred speech.   2149 - Stroke alert activated. TSRN on cart to see patient. Imaging completed.   2156 - TSMD page sent.   2206 - TSMD Richardson on cart to see patient. Imaging results given.   2217 - EDP at bedside and receiving recommendations from [facility].   2219 - TSMD/TSRN off cart.

## 2023-01-25 ENCOUNTER — Inpatient Hospital Stay (HOSPITAL_COMMUNITY): Payer: No Typology Code available for payment source

## 2023-01-25 ENCOUNTER — Other Ambulatory Visit: Payer: Self-pay

## 2023-01-25 DIAGNOSIS — G459 Transient cerebral ischemic attack, unspecified: Secondary | ICD-10-CM | POA: Diagnosis not present

## 2023-01-25 DIAGNOSIS — I951 Orthostatic hypotension: Secondary | ICD-10-CM | POA: Diagnosis not present

## 2023-01-25 DIAGNOSIS — G4733 Obstructive sleep apnea (adult) (pediatric): Secondary | ICD-10-CM

## 2023-01-25 LAB — ECHOCARDIOGRAM COMPLETE
AR max vel: 2.87 cm2
AV Area VTI: 2.84 cm2
AV Area mean vel: 2.75 cm2
AV Mean grad: 4 mm[Hg]
AV Peak grad: 8.3 mm[Hg]
Ao pk vel: 1.44 m/s
Area-P 1/2: 2.99 cm2
Calc EF: 46.1 %
MV M vel: 5.43 m/s
MV Peak grad: 117.9 mm[Hg]
P 1/2 time: 573 ms
Radius: 0.55 cm
S' Lateral: 4.65 cm
Single Plane A2C EF: 56.5 %
Single Plane A4C EF: 38.7 %

## 2023-01-25 LAB — COMPREHENSIVE METABOLIC PANEL
ALT: 17 U/L (ref 0–44)
AST: 34 U/L (ref 15–41)
Albumin: 3.8 g/dL (ref 3.5–5.0)
Alkaline Phosphatase: 57 U/L (ref 38–126)
Anion gap: 9 (ref 5–15)
BUN: 18 mg/dL (ref 8–23)
CO2: 23 mmol/L (ref 22–32)
Calcium: 8.9 mg/dL (ref 8.9–10.3)
Chloride: 107 mmol/L (ref 98–111)
Creatinine, Ser: 0.77 mg/dL (ref 0.61–1.24)
GFR, Estimated: >60 mL/min (ref >60)
Glucose, Bld: 97 mg/dL (ref 70–99)
Potassium: 3.6 mmol/L (ref 3.5–5.1)
Sodium: 139 mmol/L (ref 135–145)
Total Bilirubin: 1 mg/dL (ref 0.3–1.2)
Total Protein: 6.5 g/dL (ref 6.5–8.1)

## 2023-01-25 LAB — LIPID PANEL
Cholesterol: 204 mg/dL — ABNORMAL HIGH (ref 0–200)
HDL: 39 mg/dL — ABNORMAL LOW (ref >40)
LDL Cholesterol: 136 mg/dL — ABNORMAL HIGH (ref 0–99)
Total CHOL/HDL Ratio: 5.2 {ratio}
Triglycerides: 143 mg/dL (ref <150)
VLDL: 29 mg/dL (ref 0–40)

## 2023-01-25 LAB — ETHANOL: Alcohol, Ethyl (B): <10 mg/dL (ref <10)

## 2023-01-25 LAB — RAPID URINE DRUG SCREEN, HOSP PERFORMED
Amphetamines: NOT DETECTED
Barbiturates: NOT DETECTED
Benzodiazepines: NOT DETECTED
Cocaine: NOT DETECTED
Opiates: NOT DETECTED
Tetrahydrocannabinol: NOT DETECTED

## 2023-01-25 LAB — HEMOGLOBIN A1C
Hgb A1c MFr Bld: 5.8 % — ABNORMAL HIGH (ref 4.8–5.6)
Mean Plasma Glucose: 119.76 mg/dL

## 2023-01-25 LAB — CBC
HCT: 39.7 % (ref 39.0–52.0)
Hemoglobin: 13.4 g/dL (ref 13.0–17.0)
MCH: 34.1 pg — ABNORMAL HIGH (ref 26.0–34.0)
MCHC: 33.8 g/dL (ref 30.0–36.0)
MCV: 101 fL — ABNORMAL HIGH (ref 80.0–100.0)
Platelets: 206 10*3/uL (ref 150–400)
RBC: 3.93 MIL/uL — ABNORMAL LOW (ref 4.22–5.81)
RDW: 12.2 % (ref 11.5–15.5)
WBC: 6.5 10*3/uL (ref 4.0–10.5)
nRBC: 0 % (ref 0.0–0.2)

## 2023-01-25 MED ORDER — BISACODYL 5 MG PO TBEC: 5.0000 mg | DELAYED_RELEASE_TABLET | Freq: Every day | ORAL | Status: DC | PRN

## 2023-01-25 MED ORDER — HEPARIN SODIUM (PORCINE) 5000 UNIT/ML IJ SOLN
5000.0000 [IU] | Freq: Three times a day (TID) | INTRAMUSCULAR | Status: DC
  Administered 2023-01-25 – 2023-01-26 (×5): 5000 [IU] via SUBCUTANEOUS
  Filled 2023-01-25 (×5): qty 1

## 2023-01-25 MED ORDER — SODIUM CHLORIDE 0.9% FLUSH
3.0000 mL | Freq: Two times a day (BID) | INTRAVENOUS | Status: DC
  Administered 2023-01-25 – 2023-01-26 (×3): 3 mL via INTRAVENOUS

## 2023-01-25 MED ORDER — ROSUVASTATIN CALCIUM 20 MG PO TABS
20.0000 mg | ORAL_TABLET | Freq: Every day | ORAL | Status: DC
  Administered 2023-01-25 – 2023-01-26 (×2): 20 mg via ORAL
  Filled 2023-01-25 (×2): qty 1

## 2023-01-25 MED ORDER — ONDANSETRON HCL 4 MG/2ML IJ SOLN: 4.0000 mg | Freq: Four times a day (QID) | INTRAMUSCULAR | Status: DC | PRN

## 2023-01-25 MED ORDER — HEPARIN SODIUM (PORCINE) 5000 UNIT/ML IJ SOLN
5000.0000 [IU] | Freq: Three times a day (TID) | INTRAMUSCULAR | Status: DC
  Filled 2023-01-25: qty 1

## 2023-01-25 MED ORDER — SODIUM CHLORIDE 0.9% FLUSH
3.0000 mL | INTRAVENOUS | Status: DC | PRN
  Administered 2023-01-25: 3 mL via INTRAVENOUS

## 2023-01-25 MED ORDER — ACETAMINOPHEN 650 MG RE SUPP: 650.0000 mg | Freq: Four times a day (QID) | RECTAL | Status: DC | PRN

## 2023-01-25 MED ORDER — SODIUM CHLORIDE (PF) 0.9 % IJ SOLN
INTRAMUSCULAR | Status: AC
  Filled 2023-01-25: qty 50

## 2023-01-25 MED ORDER — ACETAMINOPHEN 325 MG PO TABS: 650.0000 mg | ORAL_TABLET | Freq: Four times a day (QID) | ORAL | Status: DC | PRN

## 2023-01-25 MED ORDER — SODIUM CHLORIDE 0.9 % IV SOLN: 250.0000 mL | INTRAVENOUS | Status: DC | PRN

## 2023-01-25 MED ORDER — IOHEXOL 350 MG/ML SOLN
75.0000 mL | Freq: Once | INTRAVENOUS | Status: AC | PRN
  Administered 2023-01-25: 75 mL via INTRAVENOUS

## 2023-01-25 MED ORDER — ONDANSETRON HCL 4 MG PO TABS: 4.0000 mg | ORAL_TABLET | Freq: Four times a day (QID) | ORAL | Status: DC | PRN

## 2023-01-25 NOTE — Plan of Care (Signed)
  Problem: Education: Goal: Knowledge of the prescribed therapeutic regimen will improve Outcome: Progressing   Problem: Activity: Goal: Ability to tolerate increased activity will improve Outcome: Progressing   Problem: Pain Management: Goal: Pain level will decrease with appropriate interventions Outcome: Progressing   Problem: Health Behavior/Discharge Planning: Goal: Ability to manage health-related needs will improve Outcome: Progressing   Problem: Clinical Measurements: Goal: Will remain free from infection Outcome: Progressing Goal: Diagnostic test results will improve Outcome: Progressing

## 2023-01-25 NOTE — Progress Notes (Signed)
PROGRESS NOTE    Richard Camacho  ZOX:[id number] DOB: [birth year] DOA: 01/24/2023 PCP: [facility]    Brief Narrative: 77 y.o. 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  Patient reported that around 8:30 PM 01/24/23 he has noticed sudden onset of slurred speech with associated dizziness and confusion which resolved by itself after few minutes.  Patient was brought to ED for further evaluation. Patient denies any previous history of TIA or stroke.  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  [facility]  Teleneurology evaluated patient in the ED.  Recommended TIA workup with MRI of the brain without contrast, MRA of the head and neck, 2D echo, start aspirin and Plavix and permissive hypertension overnight. A1c 5.8 LDL 136   Assessment & Plan:   Principal Problem:   TIA (transient ischemic attack) Active Problems:   Essential hypertension   Hyperlipidemia   History of CAD (coronary artery disease)   History of PVC S/P placement of cardiac pacemaker 2023   OSA on CPAP  Transient ischemic attack Dizziness-resolved Dysarthria-resolved -Patient presenting with complaining of dizziness and slurred speech.  Patient symptoms has been entirely resolved. Per neurorecommendation giving bolus of Plavix 300 mg x 1 after that will initiate dual antiplatelet aspirin and Plavix daily. Started crestor 20 mg   History of CAD status post CABG History of PVCs status post pacemaker -Holding Lopressor for 24 hours permissive  hypertension in the setting of TIA. -EKG showing V paced rhythm heart rate 62.  There is no ST-T wave abnormality. -His pacemaker was interrogated prior to coming to the floor in the ER.  The report is being sent to the floor.   Essential hypertension -Holding Diovan and metoprolol in the setting of TIA.   Hyperlipidemia-he is agreeable with taking Crestor at this time. -Not on any lipid-lowering agent at home.  Patient takes apple cider vinegar p.o.   OSA on CPAP -Patient wife reported patient use CPAP at bedtime.   -Continue CPAP nighttime.   Estimated body mass index is 29.29 kg/m as calculated from the following:   Height as of 03/09/22: 5\' 11"  (1.803 m).   Weight as of 03/09/22: 95.3 kg.  DVT prophylaxis: Heparin code Status:full Family Communication:dw daughter Disposition Plan:  Status is: Inpatient Remains inpatient appropriate because: stroke work up   Consultants:  neuro  Procedures:none Antimicrobials none   Subjective: Speech is back to normal Anxious to go home  Objective: Vitals:   01/25/23 1128 01/25/23 1430 01/25/23 1603 01/25/23 1642  BP: 133/73 (!) 142/76 (!) 146/80 (!) 166/80  Pulse: 60 60 60 (!) 59  Resp: 18 18 18 17   Temp:  98.1 F (36.7 C) 97.9 F (36.6 C) 97.6 F (36.4 C)  TempSrc:  Oral Oral Oral  SpO2: 96% 96% 98% 96%   No intake or output data in the 24 hours ending 01/25/23 1709 There were no vitals filed for this visit.  Examination:  General exam: Appears in no acute distress Respiratory system: Clear to auscultation. Respiratory effort normal. Cardiovascular system: S1 & S2 heard, RRR. No JVD, murmurs,  rubs, gallops or clicks. No pedal edema. Gastrointestinal system: Abdomen is nondistended, soft and nontender. No organomegaly or masses felt. Normal bowel sounds heard. Central nervous system: Alert and oriented.  Extremities: No edema   Data Reviewed: I have personally reviewed following labs and imaging  studies  CBC: Recent Labs  Lab 01/24/23 2140 01/24/23 2147 01/25/23 0355  WBC 7.3  --  6.5  NEUTROABS 3.5  --   --   HGB 13.9 14.3 13.4  HCT 40.9 42.0 39.7  MCV 102.0*  --  101.0*  PLT 247  --  206   Basic Metabolic Panel: Recent Labs  Lab 01/24/23 2140 01/24/23 2147 01/25/23 0355  NA 135 140 139  K 3.6 3.7 3.6  CL 102 104 107  CO2 25  --  23  GLUCOSE 131* 126* 97  BUN 19 19 18   CREATININE 0.92 1.00 0.77  CALCIUM 9.8  --  8.9   GFR: CrCl cannot be calculated (Unknown ideal weight.). Liver Function Tests: Recent Labs  Lab 01/24/23 2140 01/25/23 0355  AST 30 34  ALT 24 17  ALKPHOS 68 57  BILITOT 0.7 1.0  PROT 7.4 6.5  ALBUMIN 4.3 3.8   No results for input(s): "LIPASE", "AMYLASE" in the last 168 hours. No results for input(s): "AMMONIA" in the last 168 hours. Coagulation Profile: Recent Labs  Lab 01/24/23 2140  INR 1.0   Cardiac Enzymes: No results for input(s): "CKTOTAL", "CKMB", "CKMBINDEX", "TROPONINI" in the last 168 hours. BNP (last 3 results) No results for input(s): "PROBNP" in the last 8760 hours. HbA1C: Recent Labs    01/25/23 0355  HGBA1C 5.8*   CBG: Recent Labs  Lab 01/24/23 2140  GLUCAP 133*   Lipid Profile: Recent Labs    01/25/23 0355  CHOL 204*  HDL 39*  LDLCALC 136*  TRIG 143  CHOLHDL 5.2   Thyroid Function Tests: No results for input(s): "TSH", "T4TOTAL", "FREET4", "T3FREE", "THYROIDAB" in the last 72 hours. Anemia Panel: No results for input(s): "VITAMINB12", "FOLATE", "FERRITIN", "TIBC", "IRON", "RETICCTPCT" in the last 72 hours. Sepsis Labs: No results for input(s): "PROCALCITON", "LATICACIDVEN" in the last 168 hours.  No results found for this or any previous visit (from the past 240 hour(s)).       Radiology Studies: ECHOCARDIOGRAM COMPLETE  Result Date: 01/25/2023    ECHOCARDIOGRAM REPORT   Patient Name:   Richard Camacho Date of Exam: 01/25/2023 Medical Rec #:  [id number]       Height:       71.0 in  Accession #:    [id number]      Weight:       210.0 lb Date of Birth:  Feb 13, 1946        BSA:          2.153 m Patient Age:    62 years        BP:           133/73 mmHg Patient Gender: M               HR:           60 bpm. Exam Location:  Inpatient Procedure: 2D Echo, 3D Echo, Cardiac Doppler and Color Doppler Indications:    TIA  History:        Patient has prior history of Echocardiogram examinations, most                 recent 07/19/2013. CAD, Abnormal ECG and Pacemaker, TIA,  Arrythmias:Bradycardia; Risk Factors:Dyslipidemia, Hypertension                 and Sleep Apnea.  Sonographer:    Sheralyn Boatman RDCS Referring Phys: [id number] [facility]  Sonographer Comments: Suboptimal subcostal window. IMPRESSIONS  1. Left ventricular ejection fraction, by estimation, is 50 to 55%. Left ventricular ejection fraction by 3D volume is 51 %. The left ventricle has normal function. The left ventricle has no regional wall motion abnormalities. There is mild concentric left ventricular hypertrophy. Left ventricular diastolic parameters are consistent with Grade II diastolic dysfunction (pseudonormalization). There is moderate hypokinesis of the left ventricular, basal-mid inferolateral wall.  2. Right ventricular systolic function is mildly reduced. The right ventricular size is mildly enlarged. Mildly increased right ventricular wall thickness. There is normal pulmonary artery systolic pressure. The estimated right ventricular systolic pressure is 26.2 mmHg.  3. Left atrial size was severely dilated.  4. The mitral valve is normal in structure. Moderate mitral valve regurgitation. No evidence of mitral stenosis.  5. The aortic valve is tricuspid. There is mild calcification of the aortic valve. There is mild thickening of the aortic valve. Aortic valve regurgitation is trivial. Aortic valve sclerosis/calcification is present, without any evidence of aortic stenosis.  6. There is mild dilatation of the ascending  aorta, measuring 40 mm.  7. The inferior vena cava is normal in size with greater than 50% respiratory variability, suggesting right atrial pressure of 3 mmHg. Comparison(s): Prior images unable to be directly viewed, comparison made by report only. FINDINGS  Left Ventricle: Left ventricular ejection fraction, by estimation, is 50 to 55%. Left ventricular ejection fraction by 3D volume is 51 %. The left ventricle has normal function. The left ventricle has no regional wall motion abnormalities. Moderate hypokinesis of the left ventricular, basal-mid inferolateral wall. The left ventricular internal cavity size was normal in size. There is mild concentric left ventricular hypertrophy. Left ventricular diastolic parameters are consistent with Grade II diastolic dysfunction (pseudonormalization).  LV Wall Scoring: The posterior wall is hypokinetic. Right Ventricle: The right ventricular size is mildly enlarged. Mildly increased right ventricular wall thickness. Right ventricular systolic function is mildly reduced. There is normal pulmonary artery systolic pressure. The tricuspid regurgitant velocity is 2.41 m/s, and with an assumed right atrial pressure of 3 mmHg, the estimated right ventricular systolic pressure is 26.2 mmHg. Left Atrium: Left atrial size was severely dilated. Right Atrium: Right atrial size was normal in size. Pericardium: There is no evidence of pericardial effusion. Mitral Valve: The mitral valve is normal in structure. Moderate mitral valve regurgitation, with centrally-directed jet. No evidence of mitral valve stenosis. Tricuspid Valve: The tricuspid valve is normal in structure. Tricuspid valve regurgitation is trivial. Aortic Valve: The aortic valve is tricuspid. There is mild calcification of the aortic valve. There is mild thickening of the aortic valve. Aortic valve regurgitation is trivial. Aortic regurgitation PHT measures 573 msec. Aortic valve sclerosis/calcification is present, without  any evidence of aortic stenosis. Aortic valve mean gradient measures 4.0 mmHg. Aortic valve peak gradient measures 8.3 mmHg. Aortic valve area, by VTI measures 2.84 cm. Pulmonic Valve: The pulmonic valve was grossly normal. Pulmonic valve regurgitation is trivial. No evidence of pulmonic stenosis. Aorta: The aortic root is normal in size and structure. There is mild dilatation of the ascending aorta, measuring 40 mm. Venous: The inferior vena cava is normal in size with greater than 50% respiratory variability, suggesting right atrial pressure of 3 mmHg. IAS/Shunts: No atrial level shunt detected  by color flow Doppler. Additional Comments: A device lead is visualized in the right ventricle and right atrium.  LEFT VENTRICLE PLAX 2D LVIDd:         5.50 cm         Diastology LVIDs:         4.65 cm         LV e' medial:    3.92 cm/s LV PW:         1.25 cm         LV E/e' medial:  17.8 LV IVS:        1.25 cm         LV e' lateral:   9.57 cm/s LVOT diam:     2.50 cm         LV E/e' lateral: 7.3 LV SV:         88 LV SV Index:   41 LVOT Area:     4.91 cm        3D Volume EF                                LV 3D EF:    Left                                             ventricul LV Volumes (MOD)                            ar LV vol d, MOD    155.0 ml                   ejection A2C:                                        fraction LV vol d, MOD    133.5 ml                   by 3D A4C:                                        volume is LV vol s, MOD    67.5 ml                    51 %. A2C: LV vol s, MOD    81.8 ml A4C:                           3D Volume EF: LV SV MOD A2C:   87.5 ml       3D EF:        51 % LV SV MOD A4C:   133.5 ml      LV EDV:       157 ml LV SV MOD BP:    67.6 ml       LV ESV:       78 ml  LV SV:        80 ml RIGHT VENTRICLE            IVC RV S prime:     8.81 cm/s  IVC diam: 1.30 cm TAPSE (M-mode): 1.0 cm LEFT ATRIUM              Index        RIGHT ATRIUM           Index LA diam:         5.80 cm  2.69 cm/m   RA Area:     26.10 cm LA Vol (A2C):   79.8 ml  37.07 ml/m  RA Volume:   75.40 ml  35.03 ml/m LA Vol (A4C):   115.0 ml 53.42 ml/m LA Biplane Vol: 99.4 ml  46.17 ml/m  AORTIC VALVE                    PULMONIC VALVE AV Area (Vmax):    2.87 cm     PR End Diast Vel: 1.19 msec AV Area (Vmean):   2.75 cm AV Area (VTI):     2.84 cm AV Vmax:           144.00 cm/s AV Vmean:          96.600 cm/s AV VTI:            0.311 m AV Peak Grad:      8.3 mmHg AV Mean Grad:      4.0 mmHg LVOT Vmax:         84.20 cm/s LVOT Vmean:        54.200 cm/s LVOT VTI:          0.180 m LVOT/AV VTI ratio: 0.58 AI PHT:            573 msec  AORTA Ao Root diam: 3.60 cm Ao Asc diam:  4.00 cm MITRAL VALVE                  TRICUSPID VALVE MV Area (PHT): 2.99 cm       TR Peak grad:   23.2 mmHg MV Decel Time: 254 msec       TR Vmax:        241.00 cm/s MR Peak grad:    117.9 mmHg MR Mean grad:    69.0 mmHg    SHUNTS MR Vmax:         543.00 cm/s  Systemic VTI:  0.18 m MR Vmean:        381.0 cm/s   Systemic Diam: 2.50 cm MR PISA:         1.90 cm MR PISA Eff ROA: 13 mm MR PISA Radius:  0.55 cm MV E velocity: 69.80 cm/s MV A velocity: 44.10 cm/s MV E/A ratio:  1.58 Mihai Croitoru MD Electronically signed by Thurmon Fair MD Signature Date/Time: 01/25/2023/4:42:24 PM    Final    DG CHEST PORT 1 VIEW  Result Date: 01/25/2023 CLINICAL DATA:  Possible stroke. EXAM: PORTABLE CHEST 1 VIEW COMPARISON:  None Available. FINDINGS: Cardiomediastinal silhouette is mildly enlarged. Left subclavian approach dual lead cardiac device in place. Prior CABG. No focal consolidation, pneumothorax, or sizable pleural effusion. Median sternotomy wires in place. No acute osseous abnormality. IMPRESSION: No active cardiopulmonary disease. Electronically Signed   By: Hart Robinsons M.D.   On: 01/25/2023 11:35   CT ANGIO HEAD NECK W WO CM  Result Date: 01/25/2023 CLINICAL DATA:  Provided history: Stroke/TIA, determine  embolic source.  Dizziness. Slurred speech. Status post pacer. EXAM: CT ANGIOGRAPHY HEAD AND NECK WITH AND WITHOUT CONTRAST TECHNIQUE: Multidetector CT imaging of the head and neck was performed using the standard protocol during bolus administration of intravenous contrast. Multiplanar CT image reconstructions and MIPs were obtained to evaluate the vascular anatomy. Carotid stenosis measurements (when applicable) are obtained utilizing NASCET criteria, using the distal internal carotid diameter as the denominator. RADIATION DOSE REDUCTION: This exam was performed according to the departmental dose-optimization program which includes automated exposure control, adjustment of the mA and/or kV according to patient size and/or use of iterative reconstruction technique. CONTRAST:  75mL OMNIPAQUE IOHEXOL 350 MG/ML SOLN COMPARISON:  Head CT 01/24/2023. FINDINGS: CT HEAD FINDINGS Brain: Generalized cerebral atrophy. Partially empty sella turcica noted. There is no acute intracranial hemorrhage. No demarcated cortical infarct. No extra-axial fluid collection. No evidence of an intracranial mass. No midline shift. Vascular: No hyperdense vessel.  Atherosclerotic calcifications. Skull: No calvarial fracture or aggressive osseous lesion. Sinuses/Orbits: No orbital mass or acute orbital finding. Minimal mucosal thickening within the bilateral maxillary sinuses. Review of the MIP images confirms the above findings CTA NECK FINDINGS Aortic arch: No consolidation within the imaged lung apices. No visible pneumothorax. Atherosclerotic plaque within the visualized aortic arch and proximal major branch vessels of the neck. No hemodynamically significant innominate or proximal subclavian artery stenosis. Right carotid system: CCA and ICA patent within the neck without stenosis. Mild atherosclerotic plaque about the carotid bifurcation and within the proximal ICA. Tortuosity of the cervical ICA. Left carotid system: CCA and ICA patent within the neck  without stenosis. Atherosclerotic plaque. Most notably, mild-to-moderate atherosclerotic plaque is present about the carotid bifurcation. Vertebral arteries: The vertebral arteries are patent within the neck without stenosis or significant atherosclerotic disease. The right vertebral artery is significantly dominant. Skeleton: C4-C5 grade 1 retrolisthesis. Cervical spondylosis. Prior median sternotomy. No acute fracture or aggressive osseous lesion. Other neck: 2 cm ovoid lesion within the right parotid gland, which may reflect a primary parotid neoplasm or enlarged/abnormal lymph node. Upper chest: No consolidation within the imaged lung apices. Review of the MIP images confirms the above findings CTA HEAD FINDINGS Anterior circulation: The intracranial internal carotid arteries are patent. Atherosclerotic plaque within both vessels with no more than mild stenosis. The M1 middle cerebral arteries are patent. No M2 proximal branch occlusion or high-grade proximal stenosis. The anterior cerebral arteries are patent. No intracranial aneurysm is identified. Posterior circulation: The left vertebral artery is developmentally diminutive beyond the PICA origin, but patent. The dominant right vertebral artery is patent intracranially without stenosis. The right posteroinferior cerebellar artery and left anterior inferior cerebellar artery are poorly delineated. The basilar artery is patent. The posterior cerebral arteries are patent. A right posterior communicating artery is present. The left posterior communicating artery is diminutive or absent. Venous sinuses: The superior sagittal sinus, internal cerebral veins, vein of Galen, straight sinus, transverse sinuses, sigmoid sinuses and visualized jugular veins are patent. There is no appreciable intracranial venous thrombosis. Anatomic variants: As described Review of the MIP images confirms the above findings IMPRESSION: CT head: 1. No evidence of an acute intracranial  abnormality. 2. Minimal mucosal thickening within the bilateral maxillary sinuses. CTA neck: 1. The common carotid and internal carotid arteries are patent within the neck without stenosis. Atherosclerotic plaque bilaterally, as described. 2. Vertebral arteries patent within the neck without stenosis or significant atherosclerotic disease. 3. 2 cm ovoid lesion within the right parotid gland, which may reflect a primary  parotid neoplasm or enlarged/abnormal lymph node. ENT referral recommended. Direct tissue sampling should be considered. CTA head: 1. The right posteroinferior cerebellar artery and left anterior inferior cerebellar artery are poorly delineated. This may be due to developmentally small vessel size/anatomic variation. However, vessel occlusion cannot be excluded. 2. No intracranial large vessel occlusion or proximal high-grade arterial stenosis elsewhere. 3. Atherosclerotic plaque within the intracranial internal carotid arteries with no more than mild stenosis. Electronically Signed   By: Jackey Loge D.O.   On: 01/25/2023 09:23   CT HEAD CODE STROKE WO CONTRAST  Result Date: 01/24/2023 CLINICAL DATA:  Code stroke.  Slurred speech EXAM: CT HEAD WITHOUT CONTRAST TECHNIQUE: Contiguous axial images were obtained from the base of the skull through the vertex without intravenous contrast. RADIATION DOSE REDUCTION: This exam was performed according to the departmental dose-optimization program which includes automated exposure control, adjustment of the mA and/or kV according to patient size and/or use of iterative reconstruction technique. COMPARISON:  None Available. FINDINGS: Brain: There is no mass, hemorrhage or extra-axial collection. The size and configuration of the ventricles and extra-axial CSF spaces are normal. There is hypoattenuation of the periventricular white matter, most commonly indicating chronic ischemic microangiopathy. Vascular: Atherosclerotic calcification of the internal  carotid arteries at the skull base. No abnormal hyperdensity of the major intracranial arteries or dural venous sinuses. Skull: The visualized skull base, calvarium and extracranial soft tissues are normal. Sinuses/Orbits: No fluid levels or advanced mucosal thickening of the visualized paranasal sinuses. No mastoid or middle ear effusion. The orbits are normal. ASPECTS [facility] Stroke Program Early CT Score) - Ganglionic level infarction (caudate, lentiform nuclei, internal capsule, insula, M1-M3 cortex): 7 - Supraganglionic infarction (M4-M6 cortex): 3 Total score (0-10 with 10 being normal): 10 IMPRESSION: 1. No acute intracranial abnormality. 2. ASPECTS is 10. These results were called by telephone at the time of interpretation on 01/24/2023 at 9:53 pm to provider MATTHEW TRIFAN , who verbally acknowledged these results. Electronically Signed   By: Deatra Robinson M.D.   On: 01/24/2023 21:53     Scheduled Meds:  aspirin EC  81 mg Oral Daily   clopidogrel  75 mg Oral Daily   heparin  5,000 Units Subcutaneous Q8H   rosuvastatin  20 mg Oral Daily   sodium chloride flush  3 mL Intravenous Q12H   Continuous Infusions:  sodium chloride       LOS: 1 day   Time spent: 39 minutes  Alwyn Ren, MD 01/25/2023, 5:09 PM

## 2023-01-25 NOTE — ED Notes (Signed)
ED TO INPATIENT HANDOFF REPORT  ED Nurse Name and Phone #: Delice Bison, RN  S Name/Age/Gender Richard Camacho 77 y.o. male Room/Bed: WA14/WA14  Code Status   Code Status: Full Code  Home/SNF/Other Home Patient oriented to: self, place, time, and situation Is this baseline? Yes   Triage Complete: Triage complete  Chief Complaint Transient ischemic attack [G45.9] TIA (transient ischemic attack) [G45.9]  Triage Note Pt reports LSN 8:30pm. Then got up and walked into kitchen. Was having hard time making sense of what he was doing and wife noticed slurring speech. Wife reports his slurred speech has improved from earlier. No deficits possibly weaker on left side with grip. 1 alcoholic drink at dinner at 6:30. Denies headache. Does not take blood thinner. HTN- valsartin, Pacemaker, metoprolol. Not diabetic, nonsmoker.  Friend witnessed- states wobbly on feet, seemed confused, slurred speech.  NIH 1 for dysarthria at this time.    Allergies Allergies  Allergen Reactions   Hornet Venom Anaphylaxis   Altace [Ramipril] Other (See Comments) and Cough    "Cough and other"   Latex Rash    Level of Care/Admitting Diagnosis ED Disposition     ED Disposition  Admit   Condition  --   Comment  Hospital Area: [facility] [[id number]]  Level of Care: Progressive [102]  Admit to Progressive based on following criteria: NEUROLOGICAL AND NEUROSURGICAL complex patients with significant risk of instability, who do not meet ICU criteria, yet require close observation or frequent assessment (< / = every 2 - 4 hours) with medical / nursing intervention.  May admit patient to [facility] or [facility] if equivalent level of care is available:: No  Covid Evaluation: Asymptomatic - no recent exposure (last 10 days) testing not required  Diagnosis: TIA (transient ischemic attack) [161096]  Admitting Physician: Alwyn Ren [[id number]]  Attending Physician: Alwyn Ren  [[id number]]  Certification:: I certify this patient will need inpatient services for at least 2 midnights          B Medical/Surgery History Past Medical History:  Diagnosis Date   Arthritis    Cancer [facility])    numerous skin cancers, Mohs surgery.   Coronary artery disease    High cholesterol    Hx of CABG    Hypertension    Past Surgical History:  Procedure Laterality Date   CORONARY ARTERY BYPASS GRAFT  2012   done at [facility]   MODIFIED RADICAL MASTECTOMY Left    infected breast tissue   MOHS SURGERY     TOTAL HIP ARTHROPLASTY Left 03/09/2022   Procedure: TOTAL HIP ARTHROPLASTY ANTERIOR APPROACH;  Surgeon: Ollen Gross, MD;  Location: [facility];  Service: Orthopedics;  Laterality: Left;     A IV Location/Drains/Wounds Patient Lines/Drains/Airways Status     Active Line/Drains/Airways     Name Placement date Placement time Site Days   Peripheral IV 01/24/23 18 G Anterior;Distal;Right;Upper Arm 01/24/23  2140  Arm  1   Peripheral IV 01/24/23 18 G 1.16" Anterior;Distal;Left;Upper Arm 01/24/23  2204  Arm  1   Incision (Closed) 03/09/22 Hip Left 03/09/22  1407  -- 322            Intake/Output Last 24 hours No intake or output data in the 24 hours ending 01/25/23 1538  Labs/Imaging Results for orders placed or performed during the hospital encounter of 01/24/23 (from the past 48 hour(s))  Protime-INR     Status: None   Collection Time: 01/24/23  9:40 PM  Result Value Ref Range   Prothrombin Time 13.0 11.4 - 15.2 seconds   INR 1.0 0.8 - 1.2    Comment: (NOTE) INR goal varies based on device and disease states. Performed at [facility], [address], [place], [place] [address]   APTT     Status: None   Collection Time: 01/24/23  9:40 PM  Result Value Ref Range   aPTT 31 24 - 36 seconds    Comment: Performed at [facility], [address] [place], [place] [address]  CBC     Status: Abnormal   Collection Time:  01/24/23  9:40 PM  Result Value Ref Range   WBC 7.3 4.0 - 10.5 K/uL   RBC 4.01 (L) 4.22 - 5.81 MIL/uL   Hemoglobin 13.9 13.0 - 17.0 g/dL   HCT 82.9 56.2 - 13.0 %   MCV 102.0 (H) 80.0 - 100.0 fL   MCH 34.7 (H) 26.0 - 34.0 pg   MCHC 34.0 30.0 - 36.0 g/dL   RDW 86.5 78.4 - 69.6 %   Platelets 247 150 - 400 K/uL   nRBC 0.0 0.0 - 0.2 %    Comment: Performed at [facility], [address], [place], [place] [address]  Differential     Status: Abnormal   Collection Time: 01/24/23  9:40 PM  Result Value Ref Range   Neutrophils Relative % 49 %   Neutro Abs 3.5 1.7 - 7.7 K/uL   Lymphocytes Relative 34 %   Lymphs Abs 2.5 0.7 - 4.0 K/uL   Monocytes Relative 8 %   Monocytes Absolute 0.6 0.1 - 1.0 K/uL   Eosinophils Relative 8 %   Eosinophils Absolute 0.6 (H) 0.0 - 0.5 K/uL   Basophils Relative 1 %   Basophils Absolute 0.0 0.0 - 0.1 K/uL   Immature Granulocytes 0 %   Abs Immature Granulocytes 0.02 0.00 - 0.07 K/uL    Comment: Performed at [facility], [address] [place], [place] [address]  Comprehensive metabolic panel     Status: Abnormal   Collection Time: 01/24/23  9:40 PM  Result Value Ref Range   Sodium 135 135 - 145 mmol/L   Potassium 3.6 3.5 - 5.1 mmol/L   Chloride 102 98 - 111 mmol/L   CO2 25 22 - 32 mmol/L   Glucose, Bld 131 (H) 70 - 99 mg/dL    Comment: Glucose reference range applies only to samples taken after fasting for at least 8 hours.   BUN 19 8 - 23 mg/dL   Creatinine, Ser 4.01 0.61 - 1.24 mg/dL   Calcium 9.8 8.9 - 02.7 mg/dL   Total Protein 7.4 6.5 - 8.1 g/dL   Albumin 4.3 3.5 - 5.0 g/dL   AST 30 15 - 41 U/L   ALT 24 0 - 44 U/L   Alkaline Phosphatase 68 38 - 126 U/L   Total Bilirubin 0.7 0.3 - 1.2 mg/dL   GFR, Estimated >25 >36 mL/min    Comment: (NOTE) Calculated using the CKD-EPI Creatinine Equation (2021)    Anion gap 8 5 - 15    Comment: Performed at [facility], [address],  [place], [place] [address]  CBG monitoring, ED     Status: Abnormal   Collection Time: 01/24/23  9:40 PM  Result Value Ref Range   Glucose-Capillary 133 (H) 70 - 99 mg/dL    Comment: Glucose reference range applies only to samples taken after fasting for  at least 8 hours.   Comment 1 Notify RN   I-stat chem 8, ED     Status: Abnormal   Collection Time: 01/24/23  9:47 PM  Result Value Ref Range   Sodium 140 135 - 145 mmol/L   Potassium 3.7 3.5 - 5.1 mmol/L   Chloride 104 98 - 111 mmol/L   BUN 19 8 - 23 mg/dL   Creatinine, Ser 1.61 0.61 - 1.24 mg/dL   Glucose, Bld 096 (H) 70 - 99 mg/dL    Comment: Glucose reference range applies only to samples taken after fasting for at least 8 hours.   Calcium, Ion 1.27 1.15 - 1.40 mmol/L   TCO2 26 22 - 32 mmol/L   Hemoglobin 14.3 13.0 - 17.0 g/dL   HCT 04.5 40.9 - 81.1 %  Rapid urine drug screen (hospital performed)     Status: None   Collection Time: 01/24/23 11:59 PM  Result Value Ref Range   Opiates NONE DETECTED NONE DETECTED   Cocaine NONE DETECTED NONE DETECTED   Benzodiazepines NONE DETECTED NONE DETECTED   Amphetamines NONE DETECTED NONE DETECTED   Tetrahydrocannabinol NONE DETECTED NONE DETECTED   Barbiturates NONE DETECTED NONE DETECTED    Comment: (NOTE) DRUG SCREEN FOR MEDICAL PURPOSES ONLY.  IF CONFIRMATION IS NEEDED FOR ANY PURPOSE, NOTIFY LAB WITHIN 5 DAYS.  LOWEST DETECTABLE LIMITS FOR URINE DRUG SCREEN Drug Class                     Cutoff (ng/mL) Amphetamine and metabolites    1000 Barbiturate and metabolites    200 Benzodiazepine                 200 Opiates and metabolites        300 Cocaine and metabolites        300 THC                            50 Performed at [facility], [address], [place], [place] [address]   Lipid panel     Status: Abnormal   Collection Time: 01/25/23  3:55 AM  Result Value Ref Range   Cholesterol 204 (H) 0 - 200 mg/dL   Triglycerides 295 <621 mg/dL   HDL 39 (L) >30  mg/dL   Total CHOL/HDL Ratio 5.2 RATIO   VLDL 29 0 - 40 mg/dL   LDL Cholesterol 865 (H) 0 - 99 mg/dL    Comment:        Total Cholesterol/HDL:CHD Risk Coronary Heart Disease Risk Table                     Men   Women  1/2 Average Risk   3.4   3.3  Average Risk       5.0   4.4  2 X Average Risk   9.6   7.1  3 X Average Risk  23.4   11.0        Use the calculated Patient Ratio above and the CHD Risk Table to determine the patient's CHD Risk.        ATP III CLASSIFICATION (LDL):  <100     mg/dL   Optimal  784-696  mg/dL   Near or Above                    Optimal  130-159  mg/dL   Borderline  295-284  mg/dL  High  >190     mg/dL   Very High Performed at [facility], [address], [place], [place] [address]   Hemoglobin A1c     Status: Abnormal   Collection Time: 01/25/23  3:55 AM  Result Value Ref Range   Hgb A1c MFr Bld 5.8 (H) 4.8 - 5.6 %    Comment: (NOTE) Pre diabetes:          5.7%-6.4%  Diabetes:              >6.4%  Glycemic control for   <7.0% adults with diabetes    Mean Plasma Glucose 119.76 mg/dL    Comment: Performed at [facility], [address] [place], [place] [address]  Comprehensive metabolic panel     Status: None   Collection Time: 01/25/23  3:55 AM  Result Value Ref Range   Sodium 139 135 - 145 mmol/L   Potassium 3.6 3.5 - 5.1 mmol/L    Comment: HEMOLYSIS AT THIS LEVEL MAY AFFECT RESULT   Chloride 107 98 - 111 mmol/L   CO2 23 22 - 32 mmol/L   Glucose, Bld 97 70 - 99 mg/dL    Comment: Glucose reference range applies only to samples taken after fasting for at least 8 hours.   BUN 18 8 - 23 mg/dL   Creatinine, Ser 0.27 0.61 - 1.24 mg/dL   Calcium 8.9 8.9 - 25.3 mg/dL   Total Protein 6.5 6.5 - 8.1 g/dL   Albumin 3.8 3.5 - 5.0 g/dL   AST 34 15 - 41 U/L    Comment: HEMOLYSIS AT THIS LEVEL MAY AFFECT RESULT   ALT 17 0 - 44 U/L    Comment: HEMOLYSIS AT THIS LEVEL MAY AFFECT RESULT   Alkaline Phosphatase 57 38 - 126 U/L    Total Bilirubin 1.0 0.3 - 1.2 mg/dL    Comment: HEMOLYSIS AT THIS LEVEL MAY AFFECT RESULT   GFR, Estimated >60 >60 mL/min    Comment: (NOTE) Calculated using the CKD-EPI Creatinine Equation (2021)    Anion gap 9 5 - 15    Comment: Performed at [facility], [address], [place], [place] [address]  CBC     Status: Abnormal   Collection Time: 01/25/23  3:55 AM  Result Value Ref Range   WBC 6.5 4.0 - 10.5 K/uL   RBC 3.93 (L) 4.22 - 5.81 MIL/uL   Hemoglobin 13.4 13.0 - 17.0 g/dL   HCT 34.7 42.5 - 95.6 %   MCV 101.0 (H) 80.0 - 100.0 fL   MCH 34.1 (H) 26.0 - 34.0 pg   MCHC 33.8 30.0 - 36.0 g/dL   RDW 38.7 56.4 - 33.2 %   Platelets 206 150 - 400 K/uL   nRBC 0.0 0.0 - 0.2 %    Comment: Performed at [facility], [address] [place], [place] [address]   DG CHEST PORT 1 VIEW  Result Date: 01/25/2023 CLINICAL DATA:  Possible stroke. EXAM: PORTABLE CHEST 1 VIEW COMPARISON:  None Available. FINDINGS: Cardiomediastinal silhouette is mildly enlarged. Left subclavian approach dual lead cardiac device in place. Prior CABG. No focal consolidation, pneumothorax, or sizable pleural effusion. Median sternotomy wires in place. No acute osseous abnormality. IMPRESSION: No active cardiopulmonary disease. Electronically Signed   By: Hart Robinsons M.D.   On: 01/25/2023 11:35   CT ANGIO HEAD NECK W WO CM  Result Date: 01/25/2023 CLINICAL DATA:  Provided history: Stroke/TIA, determine embolic source. Dizziness. Slurred  speech. Status post pacer. EXAM: CT ANGIOGRAPHY HEAD AND NECK WITH AND WITHOUT CONTRAST TECHNIQUE: Multidetector CT imaging of the head and neck was performed using the standard protocol during bolus administration of intravenous contrast. Multiplanar CT image reconstructions and MIPs were obtained to evaluate the vascular anatomy. Carotid stenosis measurements (when applicable) are obtained utilizing NASCET criteria, using the distal internal  carotid diameter as the denominator. RADIATION DOSE REDUCTION: This exam was performed according to the departmental dose-optimization program which includes automated exposure control, adjustment of the mA and/or kV according to patient size and/or use of iterative reconstruction technique. CONTRAST:  75mL OMNIPAQUE IOHEXOL 350 MG/ML SOLN COMPARISON:  Head CT 01/24/2023. FINDINGS: CT HEAD FINDINGS Brain: Generalized cerebral atrophy. Partially empty sella turcica noted. There is no acute intracranial hemorrhage. No demarcated cortical infarct. No extra-axial fluid collection. No evidence of an intracranial mass. No midline shift. Vascular: No hyperdense vessel.  Atherosclerotic calcifications. Skull: No calvarial fracture or aggressive osseous lesion. Sinuses/Orbits: No orbital mass or acute orbital finding. Minimal mucosal thickening within the bilateral maxillary sinuses. Review of the MIP images confirms the above findings CTA NECK FINDINGS Aortic arch: No consolidation within the imaged lung apices. No visible pneumothorax. Atherosclerotic plaque within the visualized aortic arch and proximal major branch vessels of the neck. No hemodynamically significant innominate or proximal subclavian artery stenosis. Right carotid system: CCA and ICA patent within the neck without stenosis. Mild atherosclerotic plaque about the carotid bifurcation and within the proximal ICA. Tortuosity of the cervical ICA. Left carotid system: CCA and ICA patent within the neck without stenosis. Atherosclerotic plaque. Most notably, mild-to-moderate atherosclerotic plaque is present about the carotid bifurcation. Vertebral arteries: The vertebral arteries are patent within the neck without stenosis or significant atherosclerotic disease. The right vertebral artery is significantly dominant. Skeleton: C4-C5 grade 1 retrolisthesis. Cervical spondylosis. Prior median sternotomy. No acute fracture or aggressive osseous lesion. Other neck: 2  cm ovoid lesion within the right parotid gland, which may reflect a primary parotid neoplasm or enlarged/abnormal lymph node. Upper chest: No consolidation within the imaged lung apices. Review of the MIP images confirms the above findings CTA HEAD FINDINGS Anterior circulation: The intracranial internal carotid arteries are patent. Atherosclerotic plaque within both vessels with no more than mild stenosis. The M1 middle cerebral arteries are patent. No M2 proximal branch occlusion or high-grade proximal stenosis. The anterior cerebral arteries are patent. No intracranial aneurysm is identified. Posterior circulation: The left vertebral artery is developmentally diminutive beyond the PICA origin, but patent. The dominant right vertebral artery is patent intracranially without stenosis. The right posteroinferior cerebellar artery and left anterior inferior cerebellar artery are poorly delineated. The basilar artery is patent. The posterior cerebral arteries are patent. A right posterior communicating artery is present. The left posterior communicating artery is diminutive or absent. Venous sinuses: The superior sagittal sinus, internal cerebral veins, vein of Galen, straight sinus, transverse sinuses, sigmoid sinuses and visualized jugular veins are patent. There is no appreciable intracranial venous thrombosis. Anatomic variants: As described Review of the MIP images confirms the above findings IMPRESSION: CT head: 1. No evidence of an acute intracranial abnormality. 2. Minimal mucosal thickening within the bilateral maxillary sinuses. CTA neck: 1. The common carotid and internal carotid arteries are patent within the neck without stenosis. Atherosclerotic plaque bilaterally, as described. 2. Vertebral arteries patent within the neck without stenosis or significant atherosclerotic disease. 3. 2 cm ovoid lesion within the right parotid gland, which may reflect a primary parotid neoplasm or enlarged/abnormal lymph  node. ENT referral recommended. Direct tissue sampling should be considered. CTA head: 1. The right posteroinferior cerebellar artery and left anterior inferior cerebellar artery are poorly delineated. This may be due to developmentally small vessel size/anatomic variation. However, vessel occlusion cannot be excluded. 2. No intracranial large vessel occlusion or proximal high-grade arterial stenosis elsewhere. 3. Atherosclerotic plaque within the intracranial internal carotid arteries with no more than mild stenosis. Electronically Signed   By: Jackey Loge D.O.   On: 01/25/2023 09:23   CT HEAD CODE STROKE WO CONTRAST  Result Date: 01/24/2023 CLINICAL DATA:  Code stroke.  Slurred speech EXAM: CT HEAD WITHOUT CONTRAST TECHNIQUE: Contiguous axial images were obtained from the base of the skull through the vertex without intravenous contrast. RADIATION DOSE REDUCTION: This exam was performed according to the departmental dose-optimization program which includes automated exposure control, adjustment of the mA and/or kV according to patient size and/or use of iterative reconstruction technique. COMPARISON:  None Available. FINDINGS: Brain: There is no mass, hemorrhage or extra-axial collection. The size and configuration of the ventricles and extra-axial CSF spaces are normal. There is hypoattenuation of the periventricular white matter, most commonly indicating chronic ischemic microangiopathy. Vascular: Atherosclerotic calcification of the internal carotid arteries at the skull base. No abnormal hyperdensity of the major intracranial arteries or dural venous sinuses. Skull: The visualized skull base, calvarium and extracranial soft tissues are normal. Sinuses/Orbits: No fluid levels or advanced mucosal thickening of the visualized paranasal sinuses. No mastoid or middle ear effusion. The orbits are normal. ASPECTS [facility] Stroke Program Early CT Score) - Ganglionic level infarction (caudate, lentiform nuclei,  internal capsule, insula, M1-M3 cortex): 7 - Supraganglionic infarction (M4-M6 cortex): 3 Total score (0-10 with 10 being normal): 10 IMPRESSION: 1. No acute intracranial abnormality. 2. ASPECTS is 10. These results were called by telephone at the time of interpretation on 01/24/2023 at 9:53 pm to provider MATTHEW TRIFAN , who verbally acknowledged these results. Electronically Signed   By: Deatra Robinson M.D.   On: 01/24/2023 21:53    Pending Labs Unresulted Labs (From admission, onward)     Start     Ordered   01/25/23 0500  Comprehensive metabolic panel  Daily,   R      01/25/23 0135   01/25/23 0500  CBC  Daily,   R      01/25/23 0135   01/24/23 2133  Ethanol  (Stroke Panel (PNL))  Once,   URGENT        01/24/23 2133            Vitals/Pain Today's Vitals   01/25/23 0800 01/25/23 0956 01/25/23 1128 01/25/23 1430  BP: 138/68  133/73 (!) 142/76  Pulse: 63  60 60  Resp: 18  18 18   Temp:  98.1 F (36.7 C)  98.1 F (36.7 C)  TempSrc:  Oral  Oral  SpO2: 98%  96% 96%  PainSc:        Isolation Precautions No active isolations  Medications Medications  sodium chloride flush (NS) 0.9 % injection 3 mL (3 mLs Intravenous Not Given 01/25/23 0923)  sodium chloride flush (NS) 0.9 % injection 3 mL (3 mLs Intravenous Not Given 01/25/23 0142)  0.9 %  sodium chloride infusion (has no administration in time range)  acetaminophen (TYLENOL) tablet 650 mg (has no administration in time range)    Or  acetaminophen (TYLENOL) suppository 650 mg (has no administration in time range)  bisacodyl (DULCOLAX) EC tablet 5 mg (has no administration in time range)  ondansetron (ZOFRAN) tablet 4 mg (has no administration in time range)    Or  ondansetron (ZOFRAN) injection 4 mg (has no administration in time range)  aspirin EC tablet 81 mg (81 mg Oral Given 01/25/23 0923)  clopidogrel (PLAVIX) tablet 75 mg (has no administration in time range)  labetalol (NORMODYNE) tablet 100 mg (has no administration in  time range)  heparin injection 5,000 Units (5,000 Units Subcutaneous Given 01/25/23 1315)  rosuvastatin (CRESTOR) tablet 20 mg (20 mg Oral Given 01/25/23 0923)  sodium chloride flush (NS) 0.9 % injection 3 mL (3 mLs Intravenous Given 01/24/23 2216)  clopidogrel (PLAVIX) tablet 300 mg (300 mg Oral Given 01/24/23 2235)  iohexol (OMNIPAQUE) 350 MG/ML injection 75 mL (75 mLs Intravenous Contrast Given 01/25/23 0828)    Mobility walks     Focused Assessments Neuro Assessment Handoff:  Swallow screen pass? Yes  Cardiac Rhythm: Ventricular paced NIH Stroke Scale  Dizziness Present: No Headache Present: No Interval: Initial Level of Consciousness (1a.)   : Alert, keenly responsive LOC Questions (1b. )   : Answers both questions correctly LOC Commands (1c. )   : Performs both tasks correctly Best Gaze (2. )  : Normal Visual (3. )  : No visual loss Facial Palsy (4. )    : Normal symmetrical movements Motor Arm, Left (5a. )   : No drift Motor Arm, Right (5b. ) : No drift Motor Leg, Left (6a. )  : No drift Motor Leg, Right (6b. ) : No drift Limb Ataxia (7. ): Absent Sensory (8. )  : Normal, no sensory loss Best Language (9. )  : No aphasia Dysarthria (10. ): Normal Extinction/Inattention (11.)   : No Abnormality Complete NIHSS TOTAL: 0 Last date known well: 01/24/23 Last time known well: 2030 Neuro Assessment: Within Defined Limits Neuro Checks:   Initial (01/24/23 2206)  Has TPA been given? No If patient is a Neuro Trauma and patient is going to OR before floor call report to 4N Charge nurse: [phone] or [phone]   R Recommendations: See Admitting Provider Note  Report given to:   Additional Notes:

## 2023-01-25 NOTE — Progress Notes (Signed)
  Echocardiogram 2D Echocardiogram has been performed.  Richard Camacho 01/25/2023, 3:22 PM

## 2023-01-25 NOTE — Progress Notes (Signed)
OT Cancellation Note  Patient Details Name: Richard Camacho MRN: [id number] DOB: 02-Jan-1946   Cancelled Treatment:    Reason Eval/Treat Not Completed: OT screened, no needs identified, will sign off Patient in room in ED reporting that he is back to baseline and has been walking himself to the bathroom. Strength is even BUE. OT to sign off per patient request.  Rosalio Loud, MS Acute Rehabilitation Department Office# [phone]  01/25/2023, 10:23 AM

## 2023-01-25 NOTE — ED Notes (Signed)
Pt ambulated to the bathroom w/out any difficulty or c/o headache, lightheadedness, or dizziness.

## 2023-01-25 NOTE — Evaluation (Signed)
Physical Therapy Evaluation Patient Details Name: Richard Camacho MRN: [id number] DOB: 1945/08/17 Today's Date: 01/25/2023  History of Present Illness  Richard Camacho is a 77 y.o. 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 MRI pending.  Clinical Impression  Patient reports feeling back to baseline. Patient demonstrated safe ambulation with  o balance deficits, ambulated x 300'. No further PT indicated.  PT will sign off.      If plan is discharge home, recommend the following: Assistance with cooking/housework;Assist for transportation   Can travel by private vehicle        Equipment Recommendations None recommended by PT  Recommendations for Other Services       Functional Status Assessment Patient has not had a recent decline in their functional status     Precautions / Restrictions Precautions Precautions: Fall      Mobility  Bed Mobility Overal bed mobility: Independent                  Transfers Overall transfer level: Independent                      Ambulation/Gait Ambulation/Gait assistance: Independent Gait Distance (Feet): 300 Feet Assistive device: None Gait Pattern/deviations: WFL(Within Functional Limits)       General Gait Details: no deficits  Stairs            Wheelchair Mobility     Tilt Bed    Modified Rankin (Stroke Patients Only)       Balance Overall balance assessment: Independent                                           Pertinent Vitals/Pain Pain Assessment Pain Assessment: No/denies pain    Home Living Family/patient expects to be discharged to:: Private  residence Living Arrangements: Spouse/significant other Available Help at Discharge: Friend(s);Family;Available 24 hours/day Type of Home: House Home Access: Level entry       Home Layout: Able to live on main level with bedroom/bathroom Home Equipment: Shower seat;Grab bars - tub/shower;Cane - single point      Prior Function Prior Level of Function : Independent/Modified Independent             Mobility Comments: SPC ADLs Comments: IND     Extremity/Trunk Assessment   Upper Extremity Assessment Upper Extremity Assessment: Overall WFL for tasks assessed    Lower Extremity Assessment Lower Extremity Assessment: Overall WFL for tasks assessed    Cervical / Trunk Assessment Cervical / Trunk Assessment: Normal  Communication   Communication Communication: No apparent difficulties  Cognition Arousal: Alert Behavior During Therapy: WFL for tasks assessed/performed Overall Cognitive Status: Within Functional Limits for tasks assessed                                          General Comments      Exercises     Assessment/Plan    PT Assessment Patient does not need any further PT services  PT Problem List  PT Treatment Interventions      PT Goals (Current goals can be found in the Care Plan section)  Acute Rehab PT Goals Patient Stated Goal: home PT Goal Formulation: All assessment and education complete, DC therapy    Frequency       Co-evaluation               AM-PAC PT "6 Clicks" Mobility  Outcome Measure Help needed turning from your back to your side while in a flat bed without using bedrails?: None Help needed moving from lying on your back to sitting on the side of a flat bed without using bedrails?: None Help needed moving to and from a bed to a chair (including a wheelchair)?: None Help needed standing up from a chair using your arms (e.g., wheelchair or bedside chair)?: None Help needed to walk in hospital  room?: None Help needed climbing 3-5 steps with a railing? : None 6 Click Score: 24    End of Session   Activity Tolerance: Patient tolerated treatment well Patient left: in bed Nurse Communication: Mobility status PT Visit Diagnosis: Unsteadiness on feet (R26.81)    Time: 1610-9604 PT Time Calculation (min) (ACUTE ONLY): 10 min   Charges:   PT Evaluation $PT Eval Low Complexity: 1 Low   PT General Charges $$ ACUTE PT VISIT: 1 Visit         Blanchard Kelch PT Acute Rehabilitation Services Office [phone] Weekend pager-[phone]   Rada Hay 01/25/2023, 1:14 PM

## 2023-01-25 NOTE — ED Notes (Signed)
Pt updated that he will not be transferred to [facility] and be staying here for admission.

## 2023-01-25 NOTE — Progress Notes (Addendum)
STROKE TEAM PROGRESS NOTE   SUBJECTIVE (INTERVAL HISTORY) His daughter is at the bedside.  Overall his condition is completely resolved. Pt stated that he takes diovan at home and his BP 120s. For the last one week, he was added on metoprolol, taking one in the morning one before bedtime. He went out of dinner and then back home got up from chair and walked to kitchen, he felt lightheadedness, no vertigo, woozy feeling in head, with some slurry speech. He was sent to ED, at that time his symptoms were gone and he walked will in the ED parking lot before coming in.    OBJECTIVE Temp:  [97.6 F (36.4 C)-98.2 F (36.8 C)] 97.6 F (36.4 C) (10/02 1642) Pulse Rate:  [59-63] 59 (10/02 1642) Cardiac Rhythm: A-V Sequential paced;Bundle branch block (10/02 1645) Resp:  [16-22] 17 (10/02 1642) BP: (116-166)/(67-80) 166/80 (10/02 1642) SpO2:  [96 %-98 %] 96 % (10/02 1642)  Recent Labs  Lab 01/24/23 2140  GLUCAP 133*   Recent Labs  Lab 01/24/23 2140 01/24/23 2147 01/25/23 0355  NA 135 140 139  K 3.6 3.7 3.6  CL 102 104 107  CO2 25  --  23  GLUCOSE 131* 126* 97  BUN 19 19 18   CREATININE 0.92 1.00 0.77  CALCIUM 9.8  --  8.9   Recent Labs  Lab 01/24/23 2140 01/25/23 0355  AST 30 34  ALT 24 17  ALKPHOS 68 57  BILITOT 0.7 1.0  PROT 7.4 6.5  ALBUMIN 4.3 3.8   Recent Labs  Lab 01/24/23 2140 01/24/23 2147 01/25/23 0355  WBC 7.3  --  6.5  NEUTROABS 3.5  --   --   HGB 13.9 14.3 13.4  HCT 40.9 42.0 39.7  MCV 102.0*  --  101.0*  PLT 247  --  206   No results for input(s): "CKTOTAL", "CKMB", "CKMBINDEX", "TROPONINI" in the last 168 hours. Recent Labs    01/24/23 2140  LABPROT 13.0  INR 1.0   No results for input(s): "COLORURINE", "LABSPEC", "PHURINE", "GLUCOSEU", "HGBUR", "BILIRUBINUR", "KETONESUR", "PROTEINUR", "UROBILINOGEN", "NITRITE", "LEUKOCYTESUR" in the last 72 hours.  Invalid input(s): "APPERANCEUR"     Component Value Date/Time   CHOL 204 (H) 01/25/2023 0355    TRIG 143 01/25/2023 0355   HDL 39 (L) 01/25/2023 0355   CHOLHDL 5.2 01/25/2023 0355   VLDL 29 01/25/2023 0355   LDLCALC 136 (H) 01/25/2023 0355   Lab Results  Component Value Date   HGBA1C 5.8 (H) 01/25/2023      Component Value Date/Time   LABOPIA NONE DETECTED 01/24/2023 2359   COCAINSCRNUR NONE DETECTED 01/24/2023 2359   LABBENZ NONE DETECTED 01/24/2023 2359   AMPHETMU NONE DETECTED 01/24/2023 2359   THCU NONE DETECTED 01/24/2023 2359   LABBARB NONE DETECTED 01/24/2023 2359    No results for input(s): "ETH" in the last 168 hours.  I have personally reviewed the radiological images below and agree with the radiology interpretations.  ECHOCARDIOGRAM COMPLETE  Result Date: 01/25/2023    ECHOCARDIOGRAM REPORT   Patient Name:   TAGG EUSTICE Date of Exam: 01/25/2023 Medical Rec #:  [id number]       Height:       71.0 in Accession #:    [id number]      Weight:       210.0 lb Date of Birth:  1946/02/28        BSA:          2.153 m Patient Age:  77 years        BP:           133/73 mmHg Patient Gender: M               HR:           60 bpm. Exam Location:  Inpatient Procedure: 2D Echo, 3D Echo, Cardiac Doppler and Color Doppler Indications:    TIA  History:        Patient has prior history of Echocardiogram examinations, most                 recent 07/19/2013. CAD, Abnormal ECG and Pacemaker, TIA,                 Arrythmias:Bradycardia; Risk Factors:Dyslipidemia, Hypertension                 and Sleep Apnea.  Sonographer:    Sheralyn Boatman RDCS Referring Phys: [id number] [facility]  Sonographer Comments: Suboptimal subcostal window. IMPRESSIONS  1. Left ventricular ejection fraction, by estimation, is 50 to 55%. Left ventricular ejection fraction by 3D volume is 51 %. The left ventricle has normal function. The left ventricle has no regional wall motion abnormalities. There is mild concentric left ventricular hypertrophy. Left ventricular diastolic parameters are consistent with Grade II diastolic  dysfunction (pseudonormalization). There is moderate hypokinesis of the left ventricular, basal-mid inferolateral wall.  2. Right ventricular systolic function is mildly reduced. The right ventricular size is mildly enlarged. Mildly increased right ventricular wall thickness. There is normal pulmonary artery systolic pressure. The estimated right ventricular systolic pressure is 26.2 mmHg.  3. Left atrial size was severely dilated.  4. The mitral valve is normal in structure. Moderate mitral valve regurgitation. No evidence of mitral stenosis.  5. The aortic valve is tricuspid. There is mild calcification of the aortic valve. There is mild thickening of the aortic valve. Aortic valve regurgitation is trivial. Aortic valve sclerosis/calcification is present, without any evidence of aortic stenosis.  6. There is mild dilatation of the ascending aorta, measuring 40 mm.  7. The inferior vena cava is normal in size with greater than 50% respiratory variability, suggesting right atrial pressure of 3 mmHg. Comparison(s): Prior images unable to be directly viewed, comparison made by report only. FINDINGS  Left Ventricle: Left ventricular ejection fraction, by estimation, is 50 to 55%. Left ventricular ejection fraction by 3D volume is 51 %. The left ventricle has normal function. The left ventricle has no regional wall motion abnormalities. Moderate hypokinesis of the left ventricular, basal-mid inferolateral wall. The left ventricular internal cavity size was normal in size. There is mild concentric left ventricular hypertrophy. Left ventricular diastolic parameters are consistent with Grade II diastolic dysfunction (pseudonormalization).  LV Wall Scoring: The posterior wall is hypokinetic. Right Ventricle: The right ventricular size is mildly enlarged. Mildly increased right ventricular wall thickness. Right ventricular systolic function is mildly reduced. There is normal pulmonary artery systolic pressure. The tricuspid  regurgitant velocity is 2.41 m/s, and with an assumed right atrial pressure of 3 mmHg, the estimated right ventricular systolic pressure is 26.2 mmHg. Left Atrium: Left atrial size was severely dilated. Right Atrium: Right atrial size was normal in size. Pericardium: There is no evidence of pericardial effusion. Mitral Valve: The mitral valve is normal in structure. Moderate mitral valve regurgitation, with centrally-directed jet. No evidence of mitral valve stenosis. Tricuspid Valve: The tricuspid valve is normal in structure. Tricuspid valve regurgitation is trivial. Aortic Valve: The aortic valve is  tricuspid. There is mild calcification of the aortic valve. There is mild thickening of the aortic valve. Aortic valve regurgitation is trivial. Aortic regurgitation PHT measures 573 msec. Aortic valve sclerosis/calcification is present, without any evidence of aortic stenosis. Aortic valve mean gradient measures 4.0 mmHg. Aortic valve peak gradient measures 8.3 mmHg. Aortic valve area, by VTI measures 2.84 cm. Pulmonic Valve: The pulmonic valve was grossly normal. Pulmonic valve regurgitation is trivial. No evidence of pulmonic stenosis. Aorta: The aortic root is normal in size and structure. There is mild dilatation of the ascending aorta, measuring 40 mm. Venous: The inferior vena cava is normal in size with greater than 50% respiratory variability, suggesting right atrial pressure of 3 mmHg. IAS/Shunts: No atrial level shunt detected by color flow Doppler. Additional Comments: A device lead is visualized in the right ventricle and right atrium.  LEFT VENTRICLE PLAX 2D LVIDd:         5.50 cm         Diastology LVIDs:         4.65 cm         LV e' medial:    3.92 cm/s LV PW:         1.25 cm         LV E/e' medial:  17.8 LV IVS:        1.25 cm         LV e' lateral:   9.57 cm/s LVOT diam:     2.50 cm         LV E/e' lateral: 7.3 LV SV:         88 LV SV Index:   41 LVOT Area:     4.91 cm        3D Volume EF                                 LV 3D EF:    Left                                             ventricul LV Volumes (MOD)                            ar LV vol d, MOD    155.0 ml                   ejection A2C:                                        fraction LV vol d, MOD    133.5 ml                   by 3D A4C:                                        volume is LV vol s, MOD    67.5 ml                    51 %. A2C: LV vol s, MOD    81.8 ml A4C:  3D Volume EF: LV SV MOD A2C:   87.5 ml       3D EF:        51 % LV SV MOD A4C:   133.5 ml      LV EDV:       157 ml LV SV MOD BP:    67.6 ml       LV ESV:       78 ml                                LV SV:        80 ml RIGHT VENTRICLE            IVC RV S prime:     8.81 cm/s  IVC diam: 1.30 cm TAPSE (M-mode): 1.0 cm LEFT ATRIUM              Index        RIGHT ATRIUM           Index LA diam:        5.80 cm  2.69 cm/m   RA Area:     26.10 cm LA Vol (A2C):   79.8 ml  37.07 ml/m  RA Volume:   75.40 ml  35.03 ml/m LA Vol (A4C):   115.0 ml 53.42 ml/m LA Biplane Vol: 99.4 ml  46.17 ml/m  AORTIC VALVE                    PULMONIC VALVE AV Area (Vmax):    2.87 cm     PR End Diast Vel: 1.19 msec AV Area (Vmean):   2.75 cm AV Area (VTI):     2.84 cm AV Vmax:           144.00 cm/s AV Vmean:          96.600 cm/s AV VTI:            0.311 m AV Peak Grad:      8.3 mmHg AV Mean Grad:      4.0 mmHg LVOT Vmax:         84.20 cm/s LVOT Vmean:        54.200 cm/s LVOT VTI:          0.180 m LVOT/AV VTI ratio: 0.58 AI PHT:            573 msec  AORTA Ao Root diam: 3.60 cm Ao Asc diam:  4.00 cm MITRAL VALVE                  TRICUSPID VALVE MV Area (PHT): 2.99 cm       TR Peak grad:   23.2 mmHg MV Decel Time: 254 msec       TR Vmax:        241.00 cm/s MR Peak grad:    117.9 mmHg MR Mean grad:    69.0 mmHg    SHUNTS MR Vmax:         543.00 cm/s  Systemic VTI:  0.18 m MR Vmean:        381.0 cm/s   Systemic Diam: 2.50 cm MR PISA:         1.90 cm MR PISA Eff ROA: 13 mm MR PISA Radius:   0.55 cm MV E velocity: 69.80 cm/s MV A velocity: 44.10 cm/s MV E/A ratio:  1.58 Mihai Croitoru MD Electronically signed by Thurmon Fair MD  Signature Date/Time: 01/25/2023/4:42:24 PM    Final    DG CHEST PORT 1 VIEW  Result Date: 01/25/2023 CLINICAL DATA:  Possible stroke. EXAM: PORTABLE CHEST 1 VIEW COMPARISON:  None Available. FINDINGS: Cardiomediastinal silhouette is mildly enlarged. Left subclavian approach dual lead cardiac device in place. Prior CABG. No focal consolidation, pneumothorax, or sizable pleural effusion. Median sternotomy wires in place. No acute osseous abnormality. IMPRESSION: No active cardiopulmonary disease. Electronically Signed   By: Hart Robinsons M.D.   On: 01/25/2023 11:35   CT ANGIO HEAD NECK W WO CM  Result Date: 01/25/2023 CLINICAL DATA:  Provided history: Stroke/TIA, determine embolic source. Dizziness. Slurred speech. Status post pacer. EXAM: CT ANGIOGRAPHY HEAD AND NECK WITH AND WITHOUT CONTRAST TECHNIQUE: Multidetector CT imaging of the head and neck was performed using the standard protocol during bolus administration of intravenous contrast. Multiplanar CT image reconstructions and MIPs were obtained to evaluate the vascular anatomy. Carotid stenosis measurements (when applicable) are obtained utilizing NASCET criteria, using the distal internal carotid diameter as the denominator. RADIATION DOSE REDUCTION: This exam was performed according to the departmental dose-optimization program which includes automated exposure control, adjustment of the mA and/or kV according to patient size and/or use of iterative reconstruction technique. CONTRAST:  75mL OMNIPAQUE IOHEXOL 350 MG/ML SOLN COMPARISON:  Head CT 01/24/2023. FINDINGS: CT HEAD FINDINGS Brain: Generalized cerebral atrophy. Partially empty sella turcica noted. There is no acute intracranial hemorrhage. No demarcated cortical infarct. No extra-axial fluid collection. No evidence of an intracranial mass. No midline  shift. Vascular: No hyperdense vessel.  Atherosclerotic calcifications. Skull: No calvarial fracture or aggressive osseous lesion. Sinuses/Orbits: No orbital mass or acute orbital finding. Minimal mucosal thickening within the bilateral maxillary sinuses. Review of the MIP images confirms the above findings CTA NECK FINDINGS Aortic arch: No consolidation within the imaged lung apices. No visible pneumothorax. Atherosclerotic plaque within the visualized aortic arch and proximal major branch vessels of the neck. No hemodynamically significant innominate or proximal subclavian artery stenosis. Right carotid system: CCA and ICA patent within the neck without stenosis. Mild atherosclerotic plaque about the carotid bifurcation and within the proximal ICA. Tortuosity of the cervical ICA. Left carotid system: CCA and ICA patent within the neck without stenosis. Atherosclerotic plaque. Most notably, mild-to-moderate atherosclerotic plaque is present about the carotid bifurcation. Vertebral arteries: The vertebral arteries are patent within the neck without stenosis or significant atherosclerotic disease. The right vertebral artery is significantly dominant. Skeleton: C4-C5 grade 1 retrolisthesis. Cervical spondylosis. Prior median sternotomy. No acute fracture or aggressive osseous lesion. Other neck: 2 cm ovoid lesion within the right parotid gland, which may reflect a primary parotid neoplasm or enlarged/abnormal lymph node. Upper chest: No consolidation within the imaged lung apices. Review of the MIP images confirms the above findings CTA HEAD FINDINGS Anterior circulation: The intracranial internal carotid arteries are patent. Atherosclerotic plaque within both vessels with no more than mild stenosis. The M1 middle cerebral arteries are patent. No M2 proximal branch occlusion or high-grade proximal stenosis. The anterior cerebral arteries are patent. No intracranial aneurysm is identified. Posterior circulation: The  left vertebral artery is developmentally diminutive beyond the PICA origin, but patent. The dominant right vertebral artery is patent intracranially without stenosis. The right posteroinferior cerebellar artery and left anterior inferior cerebellar artery are poorly delineated. The basilar artery is patent. The posterior cerebral arteries are patent. A right posterior communicating artery is present. The left posterior communicating artery is diminutive or absent. Venous sinuses: The superior sagittal sinus, internal cerebral  veins, vein of Galen, straight sinus, transverse sinuses, sigmoid sinuses and visualized jugular veins are patent. There is no appreciable intracranial venous thrombosis. Anatomic variants: As described Review of the MIP images confirms the above findings IMPRESSION: CT head: 1. No evidence of an acute intracranial abnormality. 2. Minimal mucosal thickening within the bilateral maxillary sinuses. CTA neck: 1. The common carotid and internal carotid arteries are patent within the neck without stenosis. Atherosclerotic plaque bilaterally, as described. 2. Vertebral arteries patent within the neck without stenosis or significant atherosclerotic disease. 3. 2 cm ovoid lesion within the right parotid gland, which may reflect a primary parotid neoplasm or enlarged/abnormal lymph node. ENT referral recommended. Direct tissue sampling should be considered. CTA head: 1. The right posteroinferior cerebellar artery and left anterior inferior cerebellar artery are poorly delineated. This may be due to developmentally small vessel size/anatomic variation. However, vessel occlusion cannot be excluded. 2. No intracranial large vessel occlusion or proximal high-grade arterial stenosis elsewhere. 3. Atherosclerotic plaque within the intracranial internal carotid arteries with no more than mild stenosis. Electronically Signed   By: Jackey Loge D.O.   On: 01/25/2023 09:23   CT HEAD CODE STROKE WO  CONTRAST  Result Date: 01/24/2023 CLINICAL DATA:  Code stroke.  Slurred speech EXAM: CT HEAD WITHOUT CONTRAST TECHNIQUE: Contiguous axial images were obtained from the base of the skull through the vertex without intravenous contrast. RADIATION DOSE REDUCTION: This exam was performed according to the departmental dose-optimization program which includes automated exposure control, adjustment of the mA and/or kV according to patient size and/or use of iterative reconstruction technique. COMPARISON:  None Available. FINDINGS: Brain: There is no mass, hemorrhage or extra-axial collection. The size and configuration of the ventricles and extra-axial CSF spaces are normal. There is hypoattenuation of the periventricular white matter, most commonly indicating chronic ischemic microangiopathy. Vascular: Atherosclerotic calcification of the internal carotid arteries at the skull base. No abnormal hyperdensity of the major intracranial arteries or dural venous sinuses. Skull: The visualized skull base, calvarium and extracranial soft tissues are normal. Sinuses/Orbits: No fluid levels or advanced mucosal thickening of the visualized paranasal sinuses. No mastoid or middle ear effusion. The orbits are normal. ASPECTS [facility] Stroke Program Early CT Score) - Ganglionic level infarction (caudate, lentiform nuclei, internal capsule, insula, M1-M3 cortex): 7 - Supraganglionic infarction (M4-M6 cortex): 3 Total score (0-10 with 10 being normal): 10 IMPRESSION: 1. No acute intracranial abnormality. 2. ASPECTS is 10. These results were called by telephone at the time of interpretation on 01/24/2023 at 9:53 pm to provider MATTHEW TRIFAN , who verbally acknowledged these results. Electronically Signed   By: Deatra Robinson M.D.   On: 01/24/2023 21:53     PHYSICAL EXAM  Temp:  [97.6 F (36.4 C)-98.2 F (36.8 C)] 97.6 F (36.4 C) (10/02 1642) Pulse Rate:  [59-63] 59 (10/02 1642) Resp:  [16-22] 17 (10/02 1642) BP:  (116-166)/(67-80) 166/80 (10/02 1642) SpO2:  [96 %-98 %] 96 % (10/02 1642)  General - Well nourished, well developed, in no apparent distress.  Ophthalmologic - fundi not visualized due to noncooperation.  Cardiovascular - Regular rhythm and rate.  Mental Status -  Level of arousal and orientation to time, place, and person were intact. Language including expression, naming, repetition, comprehension was assessed and found intact. Attention span and concentration were normal. Fund of Knowledge was assessed and was intact.  Cranial Nerves II - XII - II - Visual field intact OU. III, IV, VI - Extraocular movements intact. V - Facial sensation  intact bilaterally. VII - Facial movement intact bilaterally. VIII - Hearing & vestibular intact bilaterally. X - Palate elevates symmetrically. XI - Chin turning & shoulder shrug intact bilaterally. XII - Tongue protrusion intact.  Motor Strength - The patient's strength was normal in all extremities and pronator drift was absent.  Bulk was normal and fasciculations were absent.   Motor Tone - Muscle tone was assessed at the neck and appendages and was normal.  Reflexes - The patient's reflexes were symmetrical in all extremities and he had no pathological reflexes.  Sensory - Light touch, temperature/pinprick were assessed and were symmetrical.    Coordination - The patient had normal movements in the hands and feet with no ataxia or dysmetria.  Tremor was absent.  Gait and Station - deferred.   ASSESSMENT/PLAN Mr. ELSTON ALDAPE is a 77 y.o. 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 No tPA given due to symptoms resolved.    Dizziness / lightheadedness episode - likely orthostatic hypotension in the setting of recent metoprolol addtion. TIA can not completely ruled out CT no acute finding CTA head and neck athero at b/l bulb  and siphon but no stenosis MRI  pending 2D Echo  EF 50-55%, LA size severely dilated Pacer interrogation no afib or aflutter LDL 136 HgbA1c 5.8 UDS neg Heparin subq for VTE prophylaxis No antithrombotic prior to admission, now on aspirin 81 mg daily and clopidogrel 75 mg daily for 3 weeks and then ASA alone Patient counseled to be compliant with his antithrombotic medications Ongoing aggressive stroke risk factor management Therapy recommendations:  none Disposition:  home tomorrow  Hypertension Stable Long term BP goal normotensive Avoid low BP Orthostatic vitals pending Recommend home BP monitoring, discussed with pt  Hyperlipidemia Home meds:  none  LDL 136, goal < 70 Now on crestor 20 Continue statin at discharge  Other Stroke Risk Factors Advanced age CAD s/p CABG CHF  Other Active Problems PVCs s/p ablation and pacer  Hospital day # 1    Marvel Plan, MD PhD Stroke Neurology 01/25/2023 7:07 PM    To contact Stroke Continuity provider, please refer to [email or web address]. After hours, contact General Neurology

## 2023-01-25 NOTE — ED Notes (Signed)
Pt updated on still waiting for bed.

## 2023-01-26 ENCOUNTER — Ambulatory Visit (HOSPITAL_COMMUNITY): Payer: Non-veteran care | Attending: Internal Medicine

## 2023-01-26 DIAGNOSIS — G459 Transient cerebral ischemic attack, unspecified: Secondary | ICD-10-CM | POA: Diagnosis not present

## 2023-01-26 LAB — CBC
HCT: 39.6 % (ref 39.0–52.0)
Hemoglobin: 13.5 g/dL (ref 13.0–17.0)
MCH: 34.5 pg — ABNORMAL HIGH (ref 26.0–34.0)
MCHC: 34.1 g/dL (ref 30.0–36.0)
MCV: 101.3 fL — ABNORMAL HIGH (ref 80.0–100.0)
Platelets: 206 10*3/uL (ref 150–400)
RBC: 3.91 MIL/uL — ABNORMAL LOW (ref 4.22–5.81)
RDW: 12.2 % (ref 11.5–15.5)
WBC: 6.6 10*3/uL (ref 4.0–10.5)
nRBC: 0 % (ref 0.0–0.2)

## 2023-01-26 LAB — COMPREHENSIVE METABOLIC PANEL
ALT: 20 U/L (ref 0–44)
AST: 22 U/L (ref 15–41)
Albumin: 3.8 g/dL (ref 3.5–5.0)
Alkaline Phosphatase: 50 U/L (ref 38–126)
Anion gap: 13 (ref 5–15)
BUN: 14 mg/dL (ref 8–23)
CO2: 24 mmol/L (ref 22–32)
Calcium: 9.6 mg/dL (ref 8.9–10.3)
Chloride: 104 mmol/L (ref 98–111)
Creatinine, Ser: 0.66 mg/dL (ref 0.61–1.24)
GFR, Estimated: >60 mL/min (ref >60)
Glucose, Bld: 101 mg/dL — ABNORMAL HIGH (ref 70–99)
Potassium: 3.6 mmol/L (ref 3.5–5.1)
Sodium: 141 mmol/L (ref 135–145)
Total Bilirubin: 0.9 mg/dL (ref 0.3–1.2)
Total Protein: 6.7 g/dL (ref 6.5–8.1)

## 2023-01-26 LAB — FOLATE: Folate: 22.3 ng/mL (ref >5.9)

## 2023-01-26 LAB — VITAMIN B12: Vitamin B-12: 669 pg/mL (ref 180–914)

## 2023-01-26 MED ORDER — VALSARTAN 80 MG PO TABS: 40.0000 mg | ORAL_TABLET | Freq: Every day | ORAL | 4 refills | Status: AC

## 2023-01-26 MED ORDER — CLOPIDOGREL BISULFATE 75 MG PO TABS: 75.0000 mg | ORAL_TABLET | Freq: Every day | ORAL | 0 refills | Status: DC

## 2023-01-26 MED ORDER — ASPIRIN 81 MG PO TBEC: 81.0000 mg | DELAYED_RELEASE_TABLET | Freq: Every day | ORAL | 12 refills | Status: DC

## 2023-01-26 MED ORDER — ROSUVASTATIN CALCIUM 20 MG PO TABS: 20.0000 mg | ORAL_TABLET | Freq: Every day | ORAL | 4 refills | Status: DC

## 2023-01-26 NOTE — Progress Notes (Signed)
   01/25/23 2300  BiPAP/CPAP/SIPAP  BiPAP/CPAP/SIPAP Pt Type Adult  Patient Home Equipment Yes  Auto Titrate No

## 2023-01-26 NOTE — Plan of Care (Signed)
Per note, pt no acute event, neuro intact. Had MRI done today in [facility], formal report pending but on my review, no acute infarct seen. Pt is OK to be discharged from neuro standpoint. Continue DAPT for 3 weeks and then ASA alone. Continue statin. Will follow up at [facility] neurology. Pt will make appointment in [facility] or [place].   Marvel Plan, MD PhD Stroke Neurology 01/26/2023 7:31 PM

## 2023-01-26 NOTE — Plan of Care (Signed)
  Problem: Education: Goal: Knowledge of the prescribed therapeutic regimen will improve Outcome: Progressing   Problem: Education: Goal: Knowledge of General Education information will improve Description: Including pain rating scale, medication(s)/side effects and non-pharmacologic comfort measures Outcome: Progressing   Problem: Clinical Measurements: Goal: Will remain free from infection Outcome: Progressing Goal: Cardiovascular complication will be avoided Outcome: Progressing   Problem: Activity: Goal: Risk for activity intolerance will decrease Outcome: Progressing

## 2023-01-27 NOTE — Discharge Summary (Addendum)
Physician Discharge Summary  Richard Camacho ZOX:[id number] DOB: [birth year] DOA: 01/24/2023  PCP: [facility]  Admit date: 01/24/2023 Discharge date: 01/26/2023  Admitted From: Home Disposition: Home   Recommendations for Outpatient Follow-up:  Follow up with PCP in 1-2 weeks Please obtain BMP/CBC in one week [facility]  Home Health: None Equipment/Devices: None  Discharge Condition: Stable to CODE STATUS:Full Diet recommendation: Cardiac   brief/Interim Summary: 77 y.o. 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  Patient reported that around 8:30 PM 01/24/23 he has noticed sudden onset of slurred speech with associated dizziness and confusion which resolved by itself after few minutes.  Patient was brought to ED for further evaluation. Patient denies any previous history of TIA or stroke.  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  [facility]  Teleneurology evaluated patient in the ED.  Recommended TIA workup with MRI of the brain without contrast, MRA of the head and neck, 2D echo, start aspirin and Plavix and permissive hypertension overnight. A1c 5.8 LDL 136   Discharge Diagnoses:  Principal Problem:   TIA (transient ischemic attack) Active Problems:   Essential hypertension   Hyperlipidemia   History of CAD (coronary artery disease)   History of PVC S/P placement of cardiac pacemaker 2023   OSA on CPAP    Transient ischemic attack Dizziness-resolved Dysarthria-resolved -Patient  admitted with dizziness and slurred speech.  Per neurorecommendation received bolus of Plavix 300 mg x 1 after that will initiate dual antiplatelet aspirin and Plavix daily.  Discharged on aspirin and Plavix for 21 days and then aspirin only. Started crestor 20 mg discharged on Crestor.  MRI brain showed no acute stroke. Echocardiogram left atrium size was severely dilated with EF of 50 to 55%.  Pacer interrogation showed no evidence of A-fib or flutter.  Urine drug screen was negative.  CT angiogram showed no evidence of large vessel occlusion. MRI report came later after he was discharged I called his son and reported that there were no acute stroke noted in the MRI of the brain.  History of CAD status post CABG History of PVCs status post pacemaker he was not discharged on Lopressor since some of his complaints at the time of admission was concerning for orthostasis with dizziness that led to this hospitalization.  Follow-up with his EP physician in [place].   Essential hypertension continue Diovan.   Hyperlipidemia-continue  Crestor   OSA on CPAP -Patient wife reported patient use CPAP at bedtime.   -Continue CPAP nighttime.  Estimated body mass index is 30.78 kg/m as calculated from the following:   Height as of 03/09/22: 5\' 11"  (1.803 m).   Weight as of this encounter: 100.1 kg.  Discharge Instructions  Discharge Instructions     Diet - low sodium heart healthy   Complete by: As directed    Increase activity slowly   Complete by: As directed       Allergies as of 01/26/2023       Reactions   Hornet Venom Anaphylaxis   Altace [ramipril] Other (See Comments), Cough   "  Cough and other"   Latex Rash        Medication List     STOP taking these medications    metoprolol tartrate 25 MG tablet Commonly known as: LOPRESSOR   NATTOKINASE PO       TAKE these medications    APPLE CIDER VINEGAR PO Take 1 Dose by mouth See admin instructions. Take dose mixed with  Sinus Breakup Liquid   aspirin EC 81 MG tablet Take 1 tablet (81 mg total) by mouth daily. Swallow whole.   clopidogrel 75 MG tablet Commonly known as: PLAVIX Take 1 tablet (75 mg total) by mouth daily.   HOMEOPATHIC PRODUCTS PO Take 1 Dose by mouth daily. Sinus Breakup Liquid for Sinus/Immune Function (Mix with apple cider Vinegar)   K2 PLUS D3 PO Take 1 tablet by mouth in the morning.   MAGNESIUM PO Take 2 capsules by mouth in the morning and at bedtime. [employer] Magnesium L-Threonate (Magtein)   Melatonin 10 MG Tabs Take 1 tablet by mouth at bedtime.   multivitamin tablet Take 1 tablet by mouth daily.   niacinamide 500 MG tablet Take 500 mg by mouth in the morning.   rosuvastatin 20 MG tablet Commonly known as: CRESTOR Take 1 tablet (20 mg total) by mouth daily.   Saccharomyces boulardii 250 MG Pack Take 250 mg by mouth in the morning.   valsartan 80 MG tablet Commonly known as: DIOVAN Take 0.5 tablets (40 mg total) by mouth daily. What changed:  how much to take when to take this   zinc gluconate 50 MG tablet Take 50 mg by mouth daily.        Follow-up Information     [facility].   Specialty: General Practice Contact information: [address] [place] [address] [phone]                Allergies  Allergen Reactions   Hornet Venom Anaphylaxis   Altace [Ramipril] Other (See Comments) and Cough    "Cough and other"   Latex Rash    Consultations: Neuro allergy   Procedures/Studies: MR BRAIN WO CONTRAST  Result Date: 01/26/2023 CLINICAL DATA:  Transient ischemic attack EXAM: MRI HEAD WITHOUT CONTRAST TECHNIQUE: Multiplanar, multiecho pulse sequences of the brain and surrounding structures were obtained without intravenous contrast. COMPARISON:  CTA head neck 01/25/2023. FINDINGS: Brain: No acute infarct, mass effect or extra-axial collection. No acute or chronic hemorrhage. There are multiple deep  white matter lesions, including 2 that are perpendicular to the long axis of the lateral ventricles and show a punctate central focus of magnetic susceptibility effect. The midline structures are normal. Vascular: Major flow voids are preserved. Skull and upper cervical spine: Normal calvarium and skull base. Sinuses/Orbits:No paranasal sinus fluid levels or advanced mucosal thickening. No mastoid or middle ear effusion. Normal orbits. Other: There is a round lesion in the inferior right parotid gland measuring 2.3 cm. IMPRESSION: 1. No acute intracranial abnormality. 2. 2-3 white matter lesions that are concerning for demyelinating disease such as multiple sclerosis. Postcontrast imaging may be helpful to detect potential active lesions. 3. Round lesion in the inferior right parotid gland measuring 2.3 cm. This is nonspecific, as there is considerable overlap between benign and malignant parotid lesions. ENT referral is recommended for consideration of histologic sampling. Electronically Signed   By: Deatra Robinson M.D.   On: 01/26/2023 19:18   ECHOCARDIOGRAM COMPLETE  Result Date: 01/25/2023    ECHOCARDIOGRAM REPORT   Patient  Name:   Richard Camacho Date of Exam: 01/25/2023 Medical Rec #:  [id number]       Height:       71.0 in Accession #:    [id number]      Weight:       210.0 lb Date of Birth:  January 06, 1946        BSA:          2.153 m Patient Age:    27 years        BP:           133/73 mmHg Patient Gender: M               HR:           60 bpm. Exam Location:  Inpatient Procedure: 2D Echo, 3D Echo, Cardiac Doppler and Color Doppler Indications:    TIA  History:        Patient has prior history of Echocardiogram examinations, most                 recent 07/19/2013. CAD, Abnormal ECG and Pacemaker, TIA,                 Arrythmias:Bradycardia; Risk Factors:Dyslipidemia, Hypertension                 and Sleep Apnea.  Sonographer:    Sheralyn Boatman RDCS Referring Phys: [id number] [facility]  Sonographer Comments:  Suboptimal subcostal window. IMPRESSIONS  1. Left ventricular ejection fraction, by estimation, is 50 to 55%. Left ventricular ejection fraction by 3D volume is 51 %. The left ventricle has normal function. The left ventricle has no regional wall motion abnormalities. There is mild concentric left ventricular hypertrophy. Left ventricular diastolic parameters are consistent with Grade II diastolic dysfunction (pseudonormalization). There is moderate hypokinesis of the left ventricular, basal-mid inferolateral wall.  2. Right ventricular systolic function is mildly reduced. The right ventricular size is mildly enlarged. Mildly increased right ventricular wall thickness. There is normal pulmonary artery systolic pressure. The estimated right ventricular systolic pressure is 26.2 mmHg.  3. Left atrial size was severely dilated.  4. The mitral valve is normal in structure. Moderate mitral valve regurgitation. No evidence of mitral stenosis.  5. The aortic valve is tricuspid. There is mild calcification of the aortic valve. There is mild thickening of the aortic valve. Aortic valve regurgitation is trivial. Aortic valve sclerosis/calcification is present, without any evidence of aortic stenosis.  6. There is mild dilatation of the ascending aorta, measuring 40 mm.  7. The inferior vena cava is normal in size with greater than 50% respiratory variability, suggesting right atrial pressure of 3 mmHg. Comparison(s): Prior images unable to be directly viewed, comparison made by report only. FINDINGS  Left Ventricle: Left ventricular ejection fraction, by estimation, is 50 to 55%. Left ventricular ejection fraction by 3D volume is 51 %. The left ventricle has normal function. The left ventricle has no regional wall motion abnormalities. Moderate hypokinesis of the left ventricular, basal-mid inferolateral wall. The left ventricular internal cavity size was normal in size. There is mild concentric left ventricular hypertrophy.  Left ventricular diastolic parameters are consistent with Grade II diastolic dysfunction (pseudonormalization).  LV Wall Scoring: The posterior wall is hypokinetic. Right Ventricle: The right ventricular size is mildly enlarged. Mildly increased right ventricular wall thickness. Right ventricular systolic function is mildly reduced. There is normal pulmonary artery systolic pressure. The tricuspid regurgitant velocity is 2.41 m/s, and with an assumed right atrial pressure  of 3 mmHg, the estimated right ventricular systolic pressure is 26.2 mmHg. Left Atrium: Left atrial size was severely dilated. Right Atrium: Right atrial size was normal in size. Pericardium: There is no evidence of pericardial effusion. Mitral Valve: The mitral valve is normal in structure. Moderate mitral valve regurgitation, with centrally-directed jet. No evidence of mitral valve stenosis. Tricuspid Valve: The tricuspid valve is normal in structure. Tricuspid valve regurgitation is trivial. Aortic Valve: The aortic valve is tricuspid. There is mild calcification of the aortic valve. There is mild thickening of the aortic valve. Aortic valve regurgitation is trivial. Aortic regurgitation PHT measures 573 msec. Aortic valve sclerosis/calcification is present, without any evidence of aortic stenosis. Aortic valve mean gradient measures 4.0 mmHg. Aortic valve peak gradient measures 8.3 mmHg. Aortic valve area, by VTI measures 2.84 cm. Pulmonic Valve: The pulmonic valve was grossly normal. Pulmonic valve regurgitation is trivial. No evidence of pulmonic stenosis. Aorta: The aortic root is normal in size and structure. There is mild dilatation of the ascending aorta, measuring 40 mm. Venous: The inferior vena cava is normal in size with greater than 50% respiratory variability, suggesting right atrial pressure of 3 mmHg. IAS/Shunts: No atrial level shunt detected by color flow Doppler. Additional Comments: A device lead is visualized in the right  ventricle and right atrium.  LEFT VENTRICLE PLAX 2D LVIDd:         5.50 cm         Diastology LVIDs:         4.65 cm         LV e' medial:    3.92 cm/s LV PW:         1.25 cm         LV E/e' medial:  17.8 LV IVS:        1.25 cm         LV e' lateral:   9.57 cm/s LVOT diam:     2.50 cm         LV E/e' lateral: 7.3 LV SV:         88 LV SV Index:   41 LVOT Area:     4.91 cm        3D Volume EF                                LV 3D EF:    Left                                             ventricul LV Volumes (MOD)                            ar LV vol d, MOD    155.0 ml                   ejection A2C:                                        fraction LV vol d, MOD    133.5 ml                   by 3D A4C:  volume is LV vol s, MOD    67.5 ml                    51 %. A2C: LV vol s, MOD    81.8 ml A4C:                           3D Volume EF: LV SV MOD A2C:   87.5 ml       3D EF:        51 % LV SV MOD A4C:   133.5 ml      LV EDV:       157 ml LV SV MOD BP:    67.6 ml       LV ESV:       78 ml                                LV SV:        80 ml RIGHT VENTRICLE            IVC RV S prime:     8.81 cm/s  IVC diam: 1.30 cm TAPSE (M-mode): 1.0 cm LEFT ATRIUM              Index        RIGHT ATRIUM           Index LA diam:        5.80 cm  2.69 cm/m   RA Area:     26.10 cm LA Vol (A2C):   79.8 ml  37.07 ml/m  RA Volume:   75.40 ml  35.03 ml/m LA Vol (A4C):   115.0 ml 53.42 ml/m LA Biplane Vol: 99.4 ml  46.17 ml/m  AORTIC VALVE                    PULMONIC VALVE AV Area (Vmax):    2.87 cm     PR End Diast Vel: 1.19 msec AV Area (Vmean):   2.75 cm AV Area (VTI):     2.84 cm AV Vmax:           144.00 cm/s AV Vmean:          96.600 cm/s AV VTI:            0.311 m AV Peak Grad:      8.3 mmHg AV Mean Grad:      4.0 mmHg LVOT Vmax:         84.20 cm/s LVOT Vmean:        54.200 cm/s LVOT VTI:          0.180 m LVOT/AV VTI ratio: 0.58 AI PHT:            573 msec  AORTA Ao Root diam: 3.60 cm Ao Asc diam:   4.00 cm MITRAL VALVE                  TRICUSPID VALVE MV Area (PHT): 2.99 cm       TR Peak grad:   23.2 mmHg MV Decel Time: 254 msec       TR Vmax:        241.00 cm/s MR Peak grad:    117.9 mmHg MR Mean grad:    69.0 mmHg    SHUNTS MR Vmax:         543.00 cm/s  Systemic  VTI:  0.18 m MR Vmean:        381.0 cm/s   Systemic Diam: 2.50 cm MR PISA:         1.90 cm MR PISA Eff ROA: 13 mm MR PISA Radius:  0.55 cm MV E velocity: 69.80 cm/s MV A velocity: 44.10 cm/s MV E/A ratio:  1.58 Mihai Croitoru MD Electronically signed by Thurmon Fair MD Signature Date/Time: 01/25/2023/4:42:24 PM    Final    DG CHEST PORT 1 VIEW  Result Date: 01/25/2023 CLINICAL DATA:  Possible stroke. EXAM: PORTABLE CHEST 1 VIEW COMPARISON:  None Available. FINDINGS: Cardiomediastinal silhouette is mildly enlarged. Left subclavian approach dual lead cardiac device in place. Prior CABG. No focal consolidation, pneumothorax, or sizable pleural effusion. Median sternotomy wires in place. No acute osseous abnormality. IMPRESSION: No active cardiopulmonary disease. Electronically Signed   By: Hart Robinsons M.D.   On: 01/25/2023 11:35   CT ANGIO HEAD NECK W WO CM  Result Date: 01/25/2023 CLINICAL DATA:  Provided history: Stroke/TIA, determine embolic source. Dizziness. Slurred speech. Status post pacer. EXAM: CT ANGIOGRAPHY HEAD AND NECK WITH AND WITHOUT CONTRAST TECHNIQUE: Multidetector CT imaging of the head and neck was performed using the standard protocol during bolus administration of intravenous contrast. Multiplanar CT image reconstructions and MIPs were obtained to evaluate the vascular anatomy. Carotid stenosis measurements (when applicable) are obtained utilizing NASCET criteria, using the distal internal carotid diameter as the denominator. RADIATION DOSE REDUCTION: This exam was performed according to the departmental dose-optimization program which includes automated exposure control, adjustment of the mA and/or kV according to  patient size and/or use of iterative reconstruction technique. CONTRAST:  75mL OMNIPAQUE IOHEXOL 350 MG/ML SOLN COMPARISON:  Head CT 01/24/2023. FINDINGS: CT HEAD FINDINGS Brain: Generalized cerebral atrophy. Partially empty sella turcica noted. There is no acute intracranial hemorrhage. No demarcated cortical infarct. No extra-axial fluid collection. No evidence of an intracranial mass. No midline shift. Vascular: No hyperdense vessel.  Atherosclerotic calcifications. Skull: No calvarial fracture or aggressive osseous lesion. Sinuses/Orbits: No orbital mass or acute orbital finding. Minimal mucosal thickening within the bilateral maxillary sinuses. Review of the MIP images confirms the above findings CTA NECK FINDINGS Aortic arch: No consolidation within the imaged lung apices. No visible pneumothorax. Atherosclerotic plaque within the visualized aortic arch and proximal major branch vessels of the neck. No hemodynamically significant innominate or proximal subclavian artery stenosis. Right carotid system: CCA and ICA patent within the neck without stenosis. Mild atherosclerotic plaque about the carotid bifurcation and within the proximal ICA. Tortuosity of the cervical ICA. Left carotid system: CCA and ICA patent within the neck without stenosis. Atherosclerotic plaque. Most notably, mild-to-moderate atherosclerotic plaque is present about the carotid bifurcation. Vertebral arteries: The vertebral arteries are patent within the neck without stenosis or significant atherosclerotic disease. The right vertebral artery is significantly dominant. Skeleton: C4-C5 grade 1 retrolisthesis. Cervical spondylosis. Prior median sternotomy. No acute fracture or aggressive osseous lesion. Other neck: 2 cm ovoid lesion within the right parotid gland, which may reflect a primary parotid neoplasm or enlarged/abnormal lymph node. Upper chest: No consolidation within the imaged lung apices. Review of the MIP images confirms the above  findings CTA HEAD FINDINGS Anterior circulation: The intracranial internal carotid arteries are patent. Atherosclerotic plaque within both vessels with no more than mild stenosis. The M1 middle cerebral arteries are patent. No M2 proximal branch occlusion or high-grade proximal stenosis. The anterior cerebral arteries are patent. No intracranial aneurysm is identified. Posterior circulation: The left vertebral  artery is developmentally diminutive beyond the PICA origin, but patent. The dominant right vertebral artery is patent intracranially without stenosis. The right posteroinferior cerebellar artery and left anterior inferior cerebellar artery are poorly delineated. The basilar artery is patent. The posterior cerebral arteries are patent. A right posterior communicating artery is present. The left posterior communicating artery is diminutive or absent. Venous sinuses: The superior sagittal sinus, internal cerebral veins, vein of Galen, straight sinus, transverse sinuses, sigmoid sinuses and visualized jugular veins are patent. There is no appreciable intracranial venous thrombosis. Anatomic variants: As described Review of the MIP images confirms the above findings IMPRESSION: CT head: 1. No evidence of an acute intracranial abnormality. 2. Minimal mucosal thickening within the bilateral maxillary sinuses. CTA neck: 1. The common carotid and internal carotid arteries are patent within the neck without stenosis. Atherosclerotic plaque bilaterally, as described. 2. Vertebral arteries patent within the neck without stenosis or significant atherosclerotic disease. 3. 2 cm ovoid lesion within the right parotid gland, which may reflect a primary parotid neoplasm or enlarged/abnormal lymph node. ENT referral recommended. Direct tissue sampling should be considered. CTA head: 1. The right posteroinferior cerebellar artery and left anterior inferior cerebellar artery are poorly delineated. This may be due to  developmentally small vessel size/anatomic variation. However, vessel occlusion cannot be excluded. 2. No intracranial large vessel occlusion or proximal high-grade arterial stenosis elsewhere. 3. Atherosclerotic plaque within the intracranial internal carotid arteries with no more than mild stenosis. Electronically Signed   By: Jackey Loge D.O.   On: 01/25/2023 09:23   CT HEAD CODE STROKE WO CONTRAST  Result Date: 01/24/2023 CLINICAL DATA:  Code stroke.  Slurred speech EXAM: CT HEAD WITHOUT CONTRAST TECHNIQUE: Contiguous axial images were obtained from the base of the skull through the vertex without intravenous contrast. RADIATION DOSE REDUCTION: This exam was performed according to the departmental dose-optimization program which includes automated exposure control, adjustment of the mA and/or kV according to patient size and/or use of iterative reconstruction technique. COMPARISON:  None Available. FINDINGS: Brain: There is no mass, hemorrhage or extra-axial collection. The size and configuration of the ventricles and extra-axial CSF spaces are normal. There is hypoattenuation of the periventricular white matter, most commonly indicating chronic ischemic microangiopathy. Vascular: Atherosclerotic calcification of the internal carotid arteries at the skull base. No abnormal hyperdensity of the major intracranial arteries or dural venous sinuses. Skull: The visualized skull base, calvarium and extracranial soft tissues are normal. Sinuses/Orbits: No fluid levels or advanced mucosal thickening of the visualized paranasal sinuses. No mastoid or middle ear effusion. The orbits are normal. ASPECTS [facility] Stroke Program Early CT Score) - Ganglionic level infarction (caudate, lentiform nuclei, internal capsule, insula, M1-M3 cortex): 7 - Supraganglionic infarction (M4-M6 cortex): 3 Total score (0-10 with 10 being normal): 10 IMPRESSION: 1. No acute intracranial abnormality. 2. ASPECTS is 10. These results were  called by telephone at the time of interpretation on 01/24/2023 at 9:53 pm to provider MATTHEW TRIFAN , who verbally acknowledged these results. Electronically Signed   By: Deatra Robinson M.D.   On: 01/24/2023 21:53   (Echo, Carotid, EGD, Colonoscopy, ERCP)    Subjective: Anxious to go home waiting for MRI results  Discharge Exam: Vitals:   01/26/23 0513 01/26/23 1148  BP: 136/75 135/82  Pulse: 63 (!) 59  Resp: 18 18  Temp: 97.8 F (36.6 C) 98 F (36.7 C)  SpO2: 95% 94%   Vitals:   01/26/23 0049 01/26/23 0500 01/26/23 0513 01/26/23 1148  BP: (!) 148/87  136/75 135/82  Pulse: 70  63 (!) 59  Resp:   18 18  Temp: 98 F (36.7 C)  97.8 F (36.6 C) 98 F (36.7 C)  TempSrc: Oral  Oral Oral  SpO2: 96%  95% 94%  Weight:  100.1 kg      General: Pt is alert, awake, not in acute distress Cardiovascular: RRR, S1/S2 +, no rubs, no gallops Respiratory: CTA bilaterally, no wheezing, no rhonchi Abdominal: Soft, NT, ND, bowel sounds + Extremities: no edema, no cyanosis    The results of significant diagnostics from this hospitalization (including imaging, microbiology, ancillary and laboratory) are listed below for reference.     Microbiology: No results found for this or any previous visit (from the past 240 hour(s)).   Labs: BNP (last 3 results) No results for input(s): "BNP" in the last 8760 hours. Basic Metabolic Panel: Recent Labs  Lab 01/24/23 2140 01/24/23 2147 01/25/23 0355 01/26/23 0407  NA 135 140 139 141  K 3.6 3.7 3.6 3.6  CL 102 104 107 104  CO2 25  --  23 24  GLUCOSE 131* 126* 97 101*  BUN 19 19 18 14   CREATININE 0.92 1.00 0.77 0.66  CALCIUM 9.8  --  8.9 9.6   Liver Function Tests: Recent Labs  Lab 01/24/23 2140 01/25/23 0355 01/26/23 0407  AST 30 34 22  ALT 24 17 20   ALKPHOS 68 57 50  BILITOT 0.7 1.0 0.9  PROT 7.4 6.5 6.7  ALBUMIN 4.3 3.8 3.8   No results for input(s): "LIPASE", "AMYLASE" in the last 168 hours. No results for input(s):  "AMMONIA" in the last 168 hours. CBC: Recent Labs  Lab 01/24/23 2140 01/24/23 2147 01/25/23 0355 01/26/23 0407  WBC 7.3  --  6.5 6.6  NEUTROABS 3.5  --   --   --   HGB 13.9 14.3 13.4 13.5  HCT 40.9 42.0 39.7 39.6  MCV 102.0*  --  101.0* 101.3*  PLT 247  --  206 206   Cardiac Enzymes: No results for input(s): "CKTOTAL", "CKMB", "CKMBINDEX", "TROPONINI" in the last 168 hours. BNP: Invalid input(s): "POCBNP" CBG: Recent Labs  Lab 01/24/23 2140  GLUCAP 133*   D-Dimer No results for input(s): "DDIMER" in the last 72 hours. Hgb A1c Recent Labs    01/25/23 0355  HGBA1C 5.8*   Lipid Profile Recent Labs    01/25/23 0355  CHOL 204*  HDL 39*  LDLCALC 136*  TRIG 143  CHOLHDL 5.2   Thyroid function studies No results for input(s): "TSH", "T4TOTAL", "T3FREE", "THYROIDAB" in the last 72 hours.  Invalid input(s): "FREET3" Anemia work up Recent Labs    01/26/23 0407  VITAMINB12 669  FOLATE 22.3   Urinalysis    Component Value Date/Time   COLORURINE YELLOW 07/18/2013 2043   APPEARANCEUR CLEAR 07/18/2013 2043   LABSPEC 1.013 07/18/2013 2043   PHURINE 6.5 07/18/2013 2043   GLUCOSEU NEGATIVE 07/18/2013 2043   HGBUR NEGATIVE 07/18/2013 2043   BILIRUBINUR NEGATIVE 07/18/2013 2043   KETONESUR NEGATIVE 07/18/2013 2043   PROTEINUR NEGATIVE 07/18/2013 2043   UROBILINOGEN 0.2 07/18/2013 2043   NITRITE NEGATIVE 07/18/2013 2043   LEUKOCYTESUR NEGATIVE 07/18/2013 2043   Sepsis Labs Recent Labs  Lab 01/24/23 2140 01/25/23 0355 01/26/23 0407  WBC 7.3 6.5 6.6   Microbiology No results found for this or any previous visit (from the past 240 hour(s)).   Time coordinating discharge: 39  minutes  SIGNED: Alwyn Ren, MD  [facility] 01/27/2023, 4:44 PM

## 2023-02-09 ENCOUNTER — Ambulatory Visit
Admission: RE | Admit: 2023-02-09 | Discharge: 2023-02-09 | Disposition: A | Discharge status: Home / Self Care | Payer: Non-veteran care | Source: Ambulatory Visit | Attending: Family Medicine | Admitting: Family Medicine

## 2023-02-09 ENCOUNTER — Other Ambulatory Visit: Payer: Self-pay | Admitting: Family Medicine

## 2023-02-09 DIAGNOSIS — R52 Pain, unspecified: Secondary | ICD-10-CM

## 2023-02-09 DIAGNOSIS — M25551 Pain in right hip: Secondary | ICD-10-CM

## 2023-02-09 DIAGNOSIS — M79645 Pain in left finger(s): Secondary | ICD-10-CM

## 2023-02-09 DIAGNOSIS — R0602 Shortness of breath: Secondary | ICD-10-CM

## 2023-02-13 DIAGNOSIS — S62629A Displaced fracture of medial phalanx of unspecified finger, initial encounter for closed fracture: Secondary | ICD-10-CM | POA: Insufficient documentation

## 2023-08-02 ENCOUNTER — Encounter: Payer: Self-pay | Admitting: Neurology

## 2023-08-02 ENCOUNTER — Ambulatory Visit (INDEPENDENT_AMBULATORY_CARE_PROVIDER_SITE_OTHER): Payer: Self-pay | Admitting: Neurology

## 2023-08-02 VITALS — BP 137/77 | HR 60 | Ht 70.0 in | Wt 221.5 lb

## 2023-08-02 DIAGNOSIS — D110 Benign neoplasm of parotid gland: Secondary | ICD-10-CM | POA: Insufficient documentation

## 2023-08-02 DIAGNOSIS — I259 Chronic ischemic heart disease, unspecified: Secondary | ICD-10-CM | POA: Insufficient documentation

## 2023-08-02 DIAGNOSIS — I679 Cerebrovascular disease, unspecified: Secondary | ICD-10-CM | POA: Diagnosis not present

## 2023-08-02 DIAGNOSIS — Z950 Presence of cardiac pacemaker: Secondary | ICD-10-CM | POA: Insufficient documentation

## 2023-08-02 DIAGNOSIS — G4710 Hypersomnia, unspecified: Secondary | ICD-10-CM | POA: Insufficient documentation

## 2023-08-02 DIAGNOSIS — L719 Rosacea, unspecified: Secondary | ICD-10-CM | POA: Insufficient documentation

## 2023-08-02 DIAGNOSIS — L570 Actinic keratosis: Secondary | ICD-10-CM | POA: Insufficient documentation

## 2023-08-02 DIAGNOSIS — M25461 Effusion, right knee: Secondary | ICD-10-CM | POA: Insufficient documentation

## 2023-08-02 DIAGNOSIS — M25561 Pain in right knee: Secondary | ICD-10-CM | POA: Insufficient documentation

## 2023-08-02 DIAGNOSIS — R42 Dizziness and giddiness: Secondary | ICD-10-CM | POA: Insufficient documentation

## 2023-08-02 DIAGNOSIS — R0789 Other chest pain: Secondary | ICD-10-CM | POA: Insufficient documentation

## 2023-08-02 DIAGNOSIS — L259 Unspecified contact dermatitis, unspecified cause: Secondary | ICD-10-CM | POA: Insufficient documentation

## 2023-08-02 DIAGNOSIS — D485 Neoplasm of uncertain behavior of skin: Secondary | ICD-10-CM | POA: Insufficient documentation

## 2023-08-02 NOTE — Progress Notes (Signed)
 Chief Complaint  Patient presents with   New Patient (Initial Visit)    Rm12, beata girlfriend present, referral for TIA / Richard Pacas [facility] [phone]: pts gf stated that he didn't have the tia and she thinks what happened was a reaction to metoprolol.       ASSESSMENT AND PLAN  Richard Camacho is a 78 y.o. male   Transient dizziness slurred speech  Possible TIA, MRI showed small vessel disease  Patient and his friends contributed his symptoms to metoprolol,   Differentiation diagnoses also include orthostatic hypotension,  He has multiple vascular risk factor, obesity, hypertension, coronary artery disease,  Suggest aspirin 81 mg daily, increase water intake, moderate exercise  Continue follow-up with primary care  DIAGNOSTIC DATA (LABS, IMAGING, TESTING) - I reviewed patient records, labs, notes, testing and imaging myself where available.   MEDICAL HISTORY:  Richard Camacho is a 78 year old male, accompanied by his partner Richard Camacho, seen in request by his primary care doctor Richard Camacho, for evaluation of episode of dizziness  History is obtained from the patient and review of electronic medical records. I personally reviewed pertinent available imaging films in PACS.   PMHx of  HTN CABG S/p pacemaker in 2023,  Left hip replacement  He was admitted to [facility] October 1-3 2024, for acute onset of dizziness, around 8:30 PM, after dinner and sat down watching TV with raised leg for a while, he decided go to kitchen, when he makes sudden turns, he complains of dizziness, was noted to have mild slurred speech, worry about stroke, present to emergency room for evaluation, dizziness sensation lasted less than 1 hour,  This happened within the first week he was putting metoprolol,  He had a history of coronary artery disease, CABG 3 vessels, Mobitz type I heart block, PVC history, status post pacemaker  In 2013, while he was taking beta-blocker, he  reported bradycardia episode with slow heart rate to 30s  Ever since he stopped metoprolol he has no recurrent symptoms  Had extensive evaluation, MRI of the brain chronic small vessel disease, no acute abnormality CT angiogram head and neck showed no evidence of large vessel disease  Echocardiogram ejection fraction 50 to 55%, no regional wall motion abnormality Labs showed normal B12 folic acid, negative alcohol, UDS, A1c 5.8, LDL 136, normal creatinine, hemoglobin of 13.5  PHYSICAL EXAM:   Vitals:   08/02/23 1527  BP: 137/77  Pulse: 60  Weight: 221 lb 8 oz (100.5 kg)  Height: 5\' 10"  (1.778 m)   Not recorded     Body mass index is 31.78 kg/m.  PHYSICAL EXAMNIATION:  Gen: NAD, conversant, well nourised, well groomed                     Cardiovascular: Regular rate rhythm, no peripheral edema, warm, nontender. Eyes: Conjunctivae clear without exudates or hemorrhage Neck: Supple, no carotid bruits. Pulmonary: Clear to auscultation bilaterally   NEUROLOGICAL EXAM:  MENTAL STATUS: Speech/cognition: Awake, alert, oriented to history taking and casual conversation CRANIAL NERVES: CN II: Visual fields are full to confrontation. Pupils are round equal and briskly reactive to light. CN III, IV, VI: extraocular movement are normal. No ptosis. CN V: Facial sensation is intact to light touch CN VII: Face is symmetric with normal eye closure  CN VIII: Hearing is normal to causal conversation. CN IX, X: Phonation is normal. CN XI: Head turning and shoulder shrug are intact  MOTOR: There is  no pronator drift of out-stretched arms. Muscle bulk and tone are normal. Muscle strength is normal.  REFLEXES: Reflexes are 1 and symmetric at the biceps, triceps, knees, and ankles. Plantar responses are flexor.  SENSORY: Intact to light touch, pinprick and vibratory sensation are intact in fingers and toes.  COORDINATION: There is no trunk or limb dysmetria  noted.  GAIT/STANCE: Posture is normal. Gait is steady with normal steps, base, arm swing, and turning. Heel and toe walking are normal. Tandem gait is normal.  Romberg is absent.  REVIEW OF SYSTEMS:  Full 14 system review of systems performed and notable only for as above All other review of systems were negative.   ALLERGIES: Allergies  Allergen Reactions   Hornet Venom Anaphylaxis   Ramipril Cough and Other (See Comments)    "Cough and other"   Atorvastatin    Aquaphilic Rash   Latex Rash    Other Reaction(s): Not available, Unknown  Doesn't remember reaction    HOME MEDICATIONS: Current Outpatient Medications  Medication Sig Dispense Refill   APPLE CIDER VINEGAR PO Take 1 Dose by mouth See admin instructions. Take dose mixed with Sinus Breakup Liquid     MAGNESIUM PO Take 2 capsules by mouth in the morning and at bedtime. [employer] Magnesium L-Threonate (Magtein)     Melatonin 10 MG TABS Take 1 tablet by mouth at bedtime.     Multiple Vitamin (MULTIVITAMIN) tablet Take 1 tablet by mouth daily.     Nattokinase 100 MG CAPS Take 2 capsules by mouth in the morning, at noon, and at bedtime.     niacinamide 500 MG tablet Take 500 mg by mouth in the morning.     valsartan (DIOVAN) 80 MG tablet Take 0.5 tablets (40 mg total) by mouth daily. 30 tablet 4   Vitamin D-Vitamin K (K2 PLUS D3 PO) Take 1 tablet by mouth in the morning.     zinc gluconate 50 MG tablet Take 50 mg by mouth daily.     No current facility-administered medications for this visit.    PAST MEDICAL HISTORY: Past Medical History:  Diagnosis Date   Arthritis    Cancer [facility])    numerous skin cancers, Mohs surgery.   Coronary artery disease    High cholesterol    Hx of CABG    Hypertension     PAST SURGICAL HISTORY: Past Surgical History:  Procedure Laterality Date   CORONARY ARTERY BYPASS GRAFT  2012   done at [facility]   MODIFIED RADICAL MASTECTOMY Left    infected breast tissue   MOHS  SURGERY     TOTAL HIP ARTHROPLASTY Left 03/09/2022   Procedure: TOTAL HIP ARTHROPLASTY ANTERIOR APPROACH;  Surgeon: Ollen Gross, MD;  Location: [facility];  Service: Orthopedics;  Laterality: Left;    FAMILY HISTORY: Family History  Problem Relation Age of Onset   CAD Father    Bladder Cancer Father     SOCIAL HISTORY: Social History   Socioeconomic History   Marital status: Married    Spouse name: Not on file   Number of children: Not on file   Years of education: Not on file   Highest education level: Not on file  Occupational History   Not on file  Tobacco Use   Smoking status: Never   Smokeless tobacco: Never  Vaping Use   Vaping status: Never Used  Substance and Sexual Activity   Alcohol use: Yes    Comment: One drink beer thrice a week.  Drug use: Never   Sexual activity: Yes  Other Topics Concern   Not on file  Social History Narrative   Not on file   Social Drivers of Health   Financial Resource Strain: Not on file  Food Insecurity: No Food Insecurity (01/25/2023)   Hunger Vital Sign    Worried About Running Out of Food in the Last Year: Never true    Ran Out of Food in the Last Year: Never true  Transportation Needs: No Transportation Needs (01/25/2023)   PRAPARE - Administrator, Civil Service (Medical): No    Lack of Transportation (Non-Medical): No  Physical Activity: Not on file  Stress: Not on file  Social Connections: Not on file  Intimate Partner Violence: Not At Risk (01/25/2023)   Humiliation, Afraid, Rape, and Kick questionnaire    Fear of Current or Ex-Partner: No    Emotionally Abused: No    Physically Abused: No    Sexually Abused: No      Levert Feinstein, M.D. Ph.D.  [facility] [address] [place], [place] [address] Ph: [phone] Fax: [phone]  CC:  Richard Mullet, MD [address] [place],  [place] [address]  Center, [facility]
